# Patient Record
Sex: Female | Born: 1965 | Race: White | Hispanic: No | Marital: Married | Smoking: Never smoker
Health system: Southern US, Community
[De-identification: ages and names within clinical notes are randomized; demographics above are authoritative.]

## PROBLEM LIST (undated history)

## (undated) DIAGNOSIS — M1990 Unspecified osteoarthritis, unspecified site: Secondary | ICD-10-CM

## (undated) DIAGNOSIS — K219 Gastro-esophageal reflux disease without esophagitis: Secondary | ICD-10-CM

## (undated) HISTORY — DX: Gastro-esophageal reflux disease without esophagitis: K21.9

## (undated) HISTORY — PX: CHOLECYSTECTOMY: SHX55

## (undated) HISTORY — PX: SPINAL FUSION: SHX223

## (undated) HISTORY — PX: WISDOM TOOTH EXTRACTION: SHX21

## (undated) HISTORY — DX: Unspecified osteoarthritis, unspecified site: M19.90

---

## 2008-08-11 ENCOUNTER — Ambulatory Visit: Payer: Self-pay | Admitting: Cardiology

## 2011-02-14 ENCOUNTER — Other Ambulatory Visit: Payer: Self-pay | Admitting: Family Medicine

## 2011-02-14 DIAGNOSIS — Z1231 Encounter for screening mammogram for malignant neoplasm of breast: Secondary | ICD-10-CM

## 2011-02-28 ENCOUNTER — Ambulatory Visit
Admission: RE | Admit: 2011-02-28 | Discharge: 2011-02-28 | Disposition: A | Discharge status: Home / Self Care | Payer: BC Managed Care – PPO | Source: Ambulatory Visit | Attending: Family Medicine | Admitting: Family Medicine

## 2011-02-28 DIAGNOSIS — Z1231 Encounter for screening mammogram for malignant neoplasm of breast: Secondary | ICD-10-CM

## 2012-02-19 ENCOUNTER — Other Ambulatory Visit: Payer: Self-pay | Admitting: Family Medicine

## 2012-02-19 DIAGNOSIS — Z1231 Encounter for screening mammogram for malignant neoplasm of breast: Secondary | ICD-10-CM

## 2012-04-05 ENCOUNTER — Ambulatory Visit
Admission: RE | Admit: 2012-04-05 | Discharge: 2012-04-05 | Disposition: A | Discharge status: Home / Self Care | Payer: BC Managed Care – PPO | Source: Ambulatory Visit | Attending: Family Medicine | Admitting: Family Medicine

## 2012-04-05 DIAGNOSIS — Z1231 Encounter for screening mammogram for malignant neoplasm of breast: Secondary | ICD-10-CM

## 2013-03-21 ENCOUNTER — Other Ambulatory Visit: Payer: Self-pay

## 2013-03-21 DIAGNOSIS — Z1231 Encounter for screening mammogram for malignant neoplasm of breast: Secondary | ICD-10-CM

## 2013-05-06 ENCOUNTER — Ambulatory Visit
Admission: RE | Admit: 2013-05-06 | Discharge: 2013-05-06 | Disposition: A | Discharge status: Home / Self Care | Payer: BC Managed Care – PPO | Source: Ambulatory Visit

## 2013-05-06 DIAGNOSIS — Z1231 Encounter for screening mammogram for malignant neoplasm of breast: Secondary | ICD-10-CM

## 2014-05-29 ENCOUNTER — Other Ambulatory Visit: Payer: Self-pay

## 2014-05-29 DIAGNOSIS — Z1231 Encounter for screening mammogram for malignant neoplasm of breast: Secondary | ICD-10-CM

## 2014-06-13 ENCOUNTER — Encounter (INDEPENDENT_AMBULATORY_CARE_PROVIDER_SITE_OTHER): Payer: Self-pay

## 2014-06-13 ENCOUNTER — Ambulatory Visit
Admission: RE | Admit: 2014-06-13 | Discharge: 2014-06-13 | Disposition: A | Discharge status: Home / Self Care | Payer: BLUE CROSS/BLUE SHIELD | Source: Ambulatory Visit

## 2014-06-13 DIAGNOSIS — Z1231 Encounter for screening mammogram for malignant neoplasm of breast: Secondary | ICD-10-CM

## 2015-05-10 ENCOUNTER — Other Ambulatory Visit: Payer: Self-pay

## 2015-05-10 DIAGNOSIS — Z1231 Encounter for screening mammogram for malignant neoplasm of breast: Secondary | ICD-10-CM

## 2015-06-15 ENCOUNTER — Ambulatory Visit
Admission: RE | Admit: 2015-06-15 | Discharge: 2015-06-15 | Disposition: A | Discharge status: Home / Self Care | Payer: BLUE CROSS/BLUE SHIELD | Source: Ambulatory Visit

## 2015-06-15 DIAGNOSIS — Z1231 Encounter for screening mammogram for malignant neoplasm of breast: Secondary | ICD-10-CM

## 2016-02-28 DIAGNOSIS — Z6824 Body mass index (BMI) 24.0-24.9, adult: Secondary | ICD-10-CM | POA: Diagnosis not present

## 2016-02-28 DIAGNOSIS — S43432A Superior glenoid labrum lesion of left shoulder, initial encounter: Secondary | ICD-10-CM | POA: Diagnosis not present

## 2016-02-28 DIAGNOSIS — S46012A Strain of muscle(s) and tendon(s) of the rotator cuff of left shoulder, initial encounter: Secondary | ICD-10-CM | POA: Diagnosis not present

## 2016-03-05 DIAGNOSIS — Z23 Encounter for immunization: Secondary | ICD-10-CM | POA: Diagnosis not present

## 2016-05-09 ENCOUNTER — Other Ambulatory Visit: Payer: Self-pay | Admitting: Family Medicine

## 2016-05-09 DIAGNOSIS — Z1231 Encounter for screening mammogram for malignant neoplasm of breast: Secondary | ICD-10-CM

## 2016-05-15 DIAGNOSIS — J0101 Acute recurrent maxillary sinusitis: Secondary | ICD-10-CM | POA: Diagnosis not present

## 2016-06-05 DIAGNOSIS — M542 Cervicalgia: Secondary | ICD-10-CM | POA: Diagnosis not present

## 2016-06-05 DIAGNOSIS — M62830 Muscle spasm of back: Secondary | ICD-10-CM | POA: Diagnosis not present

## 2016-06-16 ENCOUNTER — Ambulatory Visit
Admission: RE | Admit: 2016-06-16 | Discharge: 2016-06-16 | Disposition: A | Discharge status: Home / Self Care | Payer: BLUE CROSS/BLUE SHIELD | Source: Ambulatory Visit | Attending: Family Medicine | Admitting: Family Medicine

## 2016-06-16 DIAGNOSIS — Z1231 Encounter for screening mammogram for malignant neoplasm of breast: Secondary | ICD-10-CM

## 2016-08-04 DIAGNOSIS — M25512 Pain in left shoulder: Secondary | ICD-10-CM | POA: Diagnosis not present

## 2016-08-04 DIAGNOSIS — M25552 Pain in left hip: Secondary | ICD-10-CM | POA: Diagnosis not present

## 2016-08-04 DIAGNOSIS — M25511 Pain in right shoulder: Secondary | ICD-10-CM | POA: Diagnosis not present

## 2016-08-04 DIAGNOSIS — M25551 Pain in right hip: Secondary | ICD-10-CM | POA: Diagnosis not present

## 2017-02-12 DIAGNOSIS — Z01419 Encounter for gynecological examination (general) (routine) without abnormal findings: Secondary | ICD-10-CM | POA: Diagnosis not present

## 2017-02-12 DIAGNOSIS — Z6823 Body mass index (BMI) 23.0-23.9, adult: Secondary | ICD-10-CM | POA: Diagnosis not present

## 2017-03-23 DIAGNOSIS — R87612 Low grade squamous intraepithelial lesion on cytologic smear of cervix (LGSIL): Secondary | ICD-10-CM | POA: Diagnosis not present

## 2017-03-23 DIAGNOSIS — N898 Other specified noninflammatory disorders of vagina: Secondary | ICD-10-CM | POA: Diagnosis not present

## 2017-05-27 ENCOUNTER — Other Ambulatory Visit: Payer: Self-pay | Admitting: Family Medicine

## 2017-05-27 DIAGNOSIS — Z1231 Encounter for screening mammogram for malignant neoplasm of breast: Secondary | ICD-10-CM

## 2017-06-29 ENCOUNTER — Ambulatory Visit
Admission: RE | Admit: 2017-06-29 | Discharge: 2017-06-29 | Disposition: A | Discharge status: Home / Self Care | Payer: BLUE CROSS/BLUE SHIELD | Source: Ambulatory Visit | Attending: Family Medicine | Admitting: Family Medicine

## 2017-06-29 DIAGNOSIS — Z1231 Encounter for screening mammogram for malignant neoplasm of breast: Secondary | ICD-10-CM | POA: Diagnosis not present

## 2017-07-14 DIAGNOSIS — J0100 Acute maxillary sinusitis, unspecified: Secondary | ICD-10-CM | POA: Diagnosis not present

## 2017-07-14 DIAGNOSIS — Z6823 Body mass index (BMI) 23.0-23.9, adult: Secondary | ICD-10-CM | POA: Diagnosis not present

## 2017-09-08 DIAGNOSIS — B977 Papillomavirus as the cause of diseases classified elsewhere: Secondary | ICD-10-CM | POA: Diagnosis not present

## 2017-09-08 DIAGNOSIS — R87612 Low grade squamous intraepithelial lesion on cytologic smear of cervix (LGSIL): Secondary | ICD-10-CM | POA: Diagnosis not present

## 2017-09-08 DIAGNOSIS — Z6823 Body mass index (BMI) 23.0-23.9, adult: Secondary | ICD-10-CM | POA: Diagnosis not present

## 2017-09-17 DIAGNOSIS — M542 Cervicalgia: Secondary | ICD-10-CM | POA: Diagnosis not present

## 2017-10-18 DIAGNOSIS — M545 Low back pain: Secondary | ICD-10-CM | POA: Diagnosis not present

## 2017-10-18 DIAGNOSIS — M5136 Other intervertebral disc degeneration, lumbar region: Secondary | ICD-10-CM | POA: Diagnosis not present

## 2017-10-20 DIAGNOSIS — Z6824 Body mass index (BMI) 24.0-24.9, adult: Secondary | ICD-10-CM | POA: Diagnosis not present

## 2017-10-20 DIAGNOSIS — M545 Low back pain: Secondary | ICD-10-CM | POA: Diagnosis not present

## 2018-04-29 DIAGNOSIS — Z6823 Body mass index (BMI) 23.0-23.9, adult: Secondary | ICD-10-CM | POA: Diagnosis not present

## 2018-04-29 DIAGNOSIS — Z01419 Encounter for gynecological examination (general) (routine) without abnormal findings: Secondary | ICD-10-CM | POA: Diagnosis not present

## 2018-06-04 ENCOUNTER — Other Ambulatory Visit: Payer: Self-pay | Admitting: Family Medicine

## 2018-06-04 DIAGNOSIS — Z1231 Encounter for screening mammogram for malignant neoplasm of breast: Secondary | ICD-10-CM

## 2018-07-05 ENCOUNTER — Ambulatory Visit
Admission: RE | Admit: 2018-07-05 | Discharge: 2018-07-05 | Disposition: A | Discharge status: Home / Self Care | Payer: BLUE CROSS/BLUE SHIELD | Source: Ambulatory Visit | Attending: Family Medicine | Admitting: Family Medicine

## 2018-07-05 DIAGNOSIS — Z1231 Encounter for screening mammogram for malignant neoplasm of breast: Secondary | ICD-10-CM

## 2019-07-17 DIAGNOSIS — R319 Hematuria, unspecified: Secondary | ICD-10-CM | POA: Diagnosis not present

## 2019-07-17 DIAGNOSIS — N23 Unspecified renal colic: Secondary | ICD-10-CM | POA: Diagnosis not present

## 2019-07-18 DIAGNOSIS — N23 Unspecified renal colic: Secondary | ICD-10-CM | POA: Diagnosis not present

## 2019-07-18 DIAGNOSIS — R1032 Left lower quadrant pain: Secondary | ICD-10-CM | POA: Diagnosis not present

## 2019-07-18 DIAGNOSIS — R3129 Other microscopic hematuria: Secondary | ICD-10-CM | POA: Diagnosis not present

## 2019-07-18 DIAGNOSIS — M25552 Pain in left hip: Secondary | ICD-10-CM | POA: Diagnosis not present

## 2019-07-18 DIAGNOSIS — R109 Unspecified abdominal pain: Secondary | ICD-10-CM | POA: Diagnosis not present

## 2019-07-28 DIAGNOSIS — Z01419 Encounter for gynecological examination (general) (routine) without abnormal findings: Secondary | ICD-10-CM | POA: Diagnosis not present

## 2019-07-28 DIAGNOSIS — Z6825 Body mass index (BMI) 25.0-25.9, adult: Secondary | ICD-10-CM | POA: Diagnosis not present

## 2019-09-02 DIAGNOSIS — Z1231 Encounter for screening mammogram for malignant neoplasm of breast: Secondary | ICD-10-CM | POA: Diagnosis not present

## 2020-09-06 IMAGING — MG DIGITAL SCREENING BILATERAL MAMMOGRAM WITH TOMO AND CAD
8 series · 8 of 24 positions shown · non-contrast
Comparison: Previous exam(s).

CLINICAL DATA: Screening.

EXAM:
DIGITAL SCREENING BILATERAL MAMMOGRAM WITH TOMO AND CAD

[L MLO synth-2D]
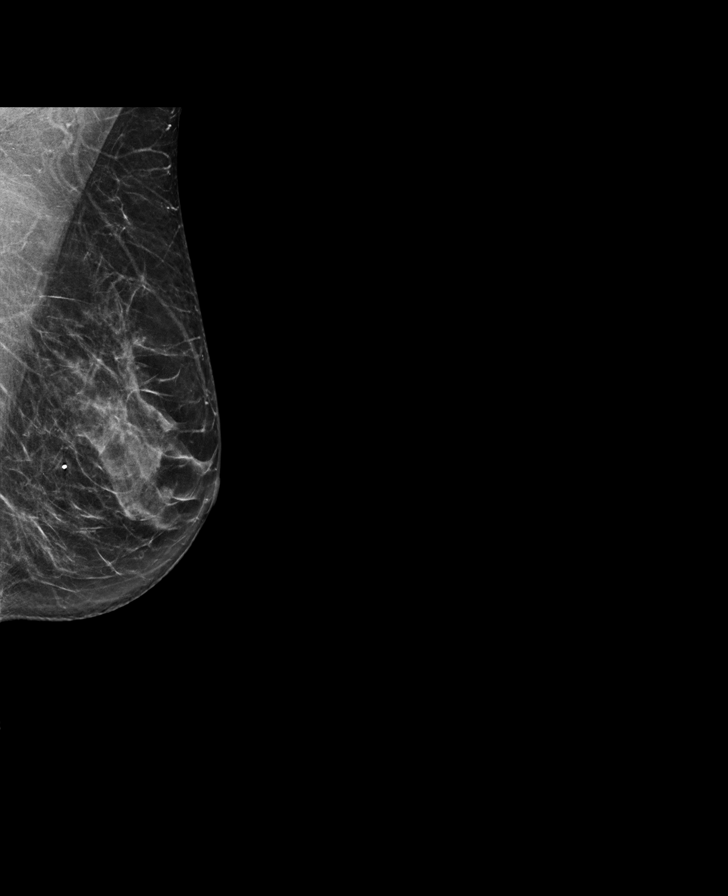

[R CC synth-2D]
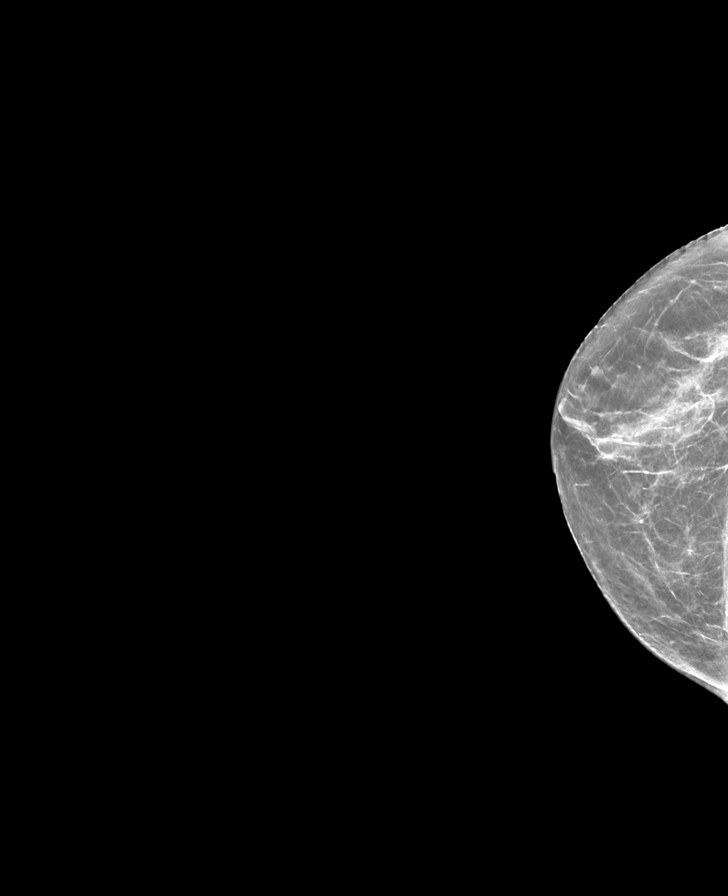

[L CC synth-2D]
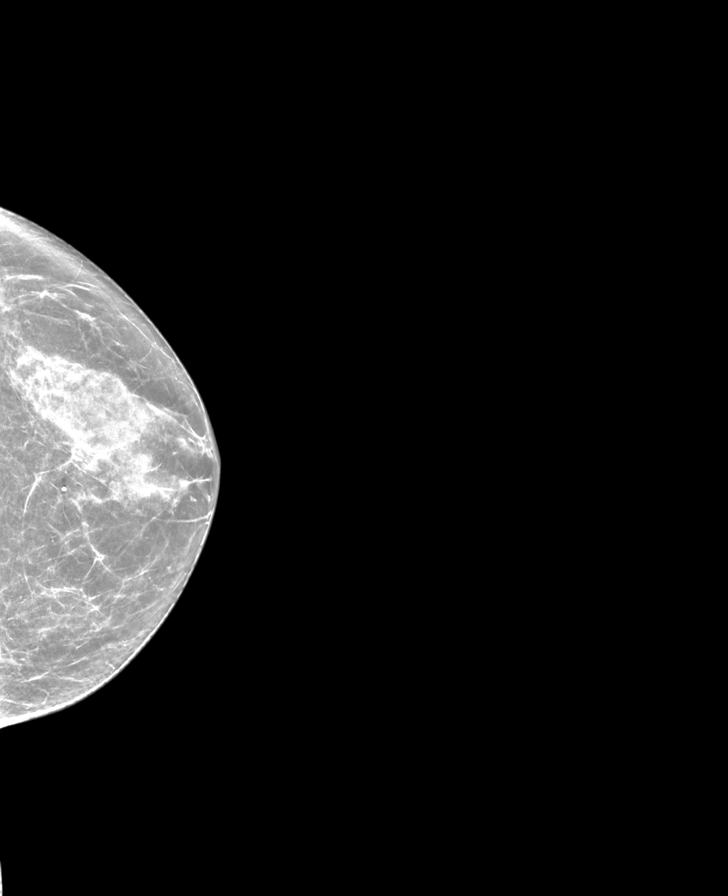

[R MLO synth-2D]
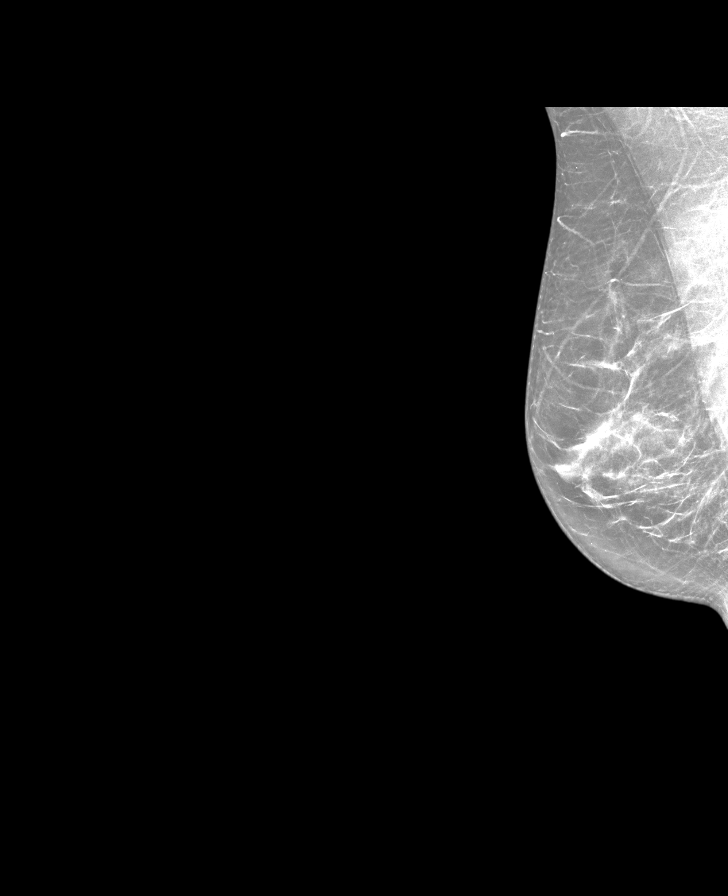

[R MLO tomo · tomo slice 32/63.0]
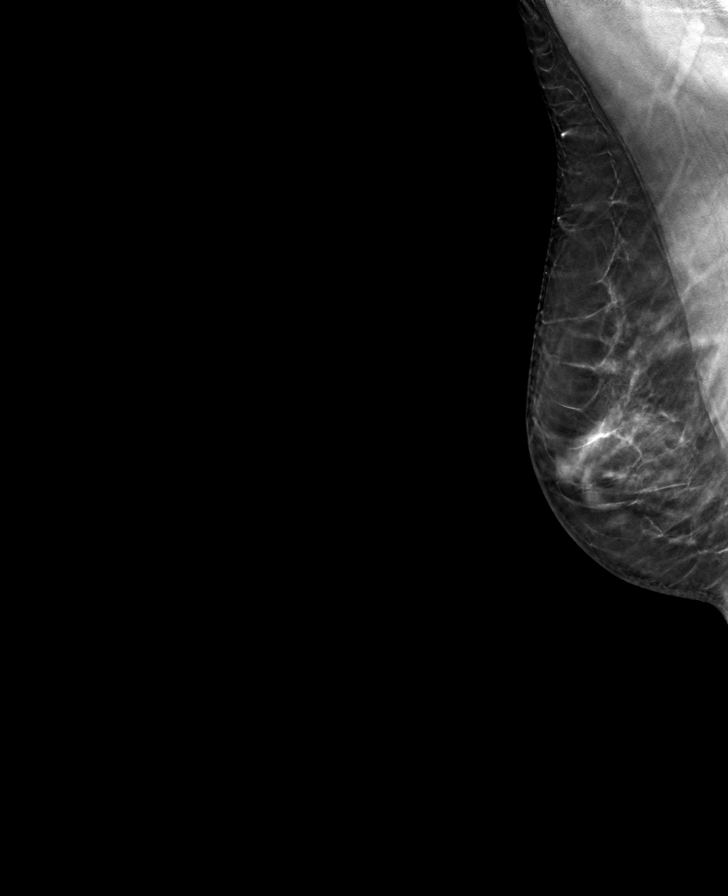

[R CC tomo · tomo slice 29/56.0]
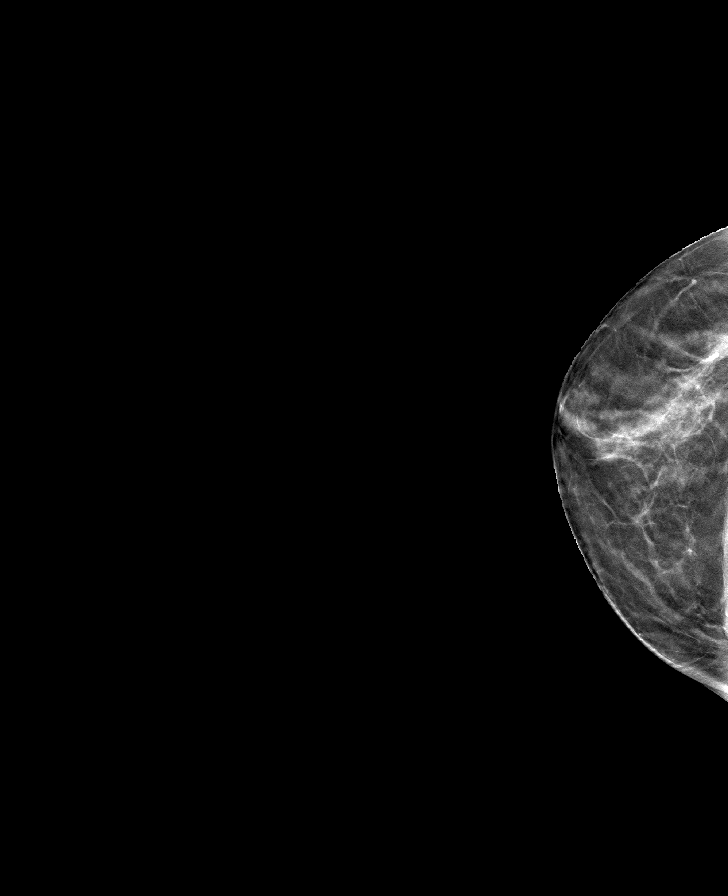

[L CC tomo · tomo slice 29/57.0]
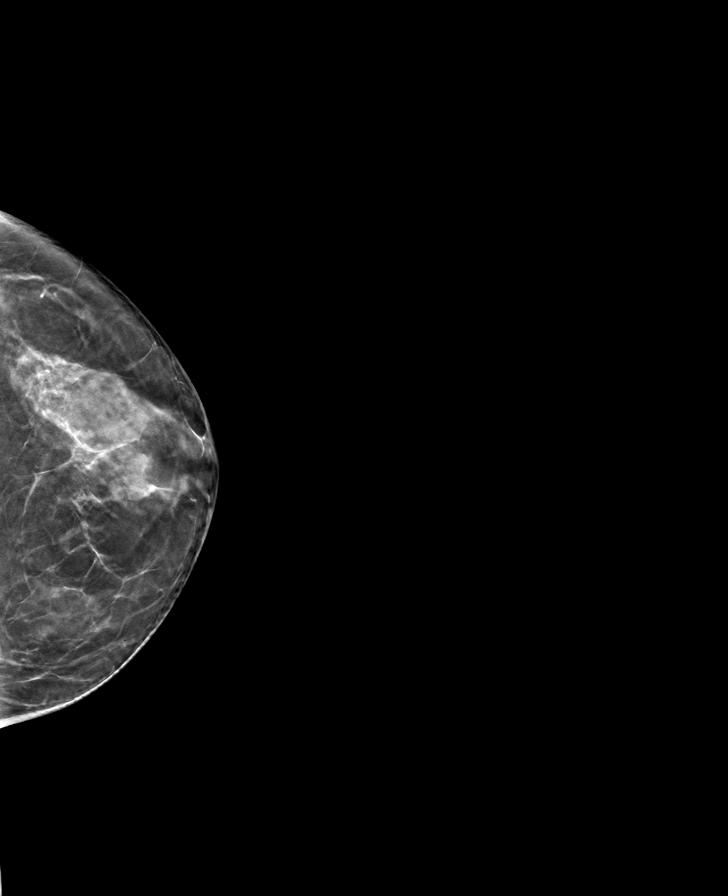

[L MLO tomo · tomo slice 31/62.0]
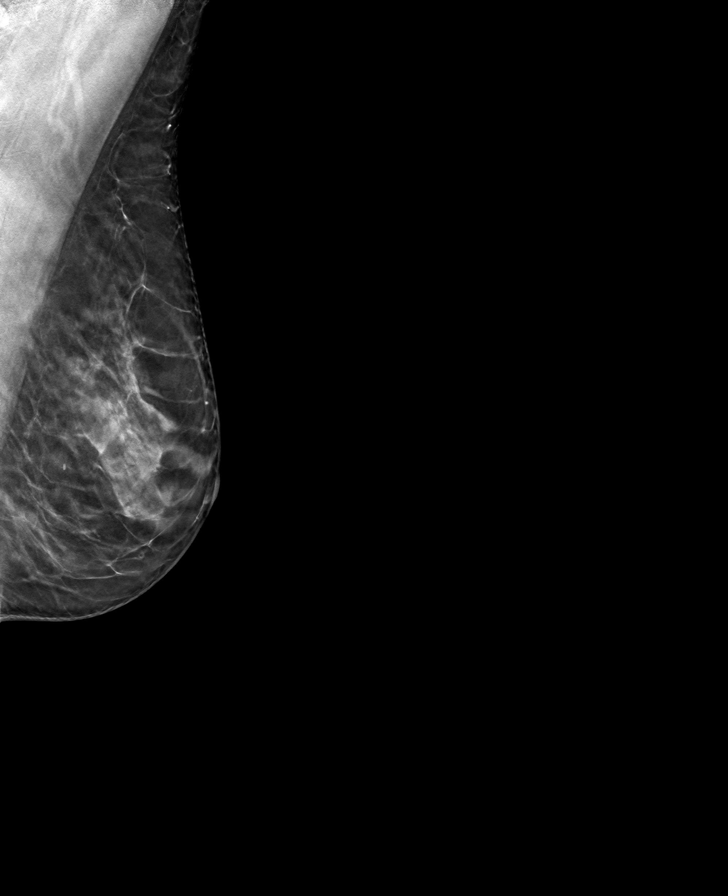

[8 of 24 positions shown; findings below may reference images not displayed]

ACR Breast Density Category c: The breast tissue is heterogeneously
dense, which may obscure small masses.
FINDINGS: There are no findings suspicious for malignancy. Images were
processed with CAD.
IMPRESSION: No mammographic evidence of malignancy. A result letter of this
screening mammogram will be mailed directly to the patient.

RECOMMENDATION:
Screening mammogram in one year. (Code:[id number])

BI-RADS CATEGORY  1: Negative.

## 2021-10-22 NOTE — Progress Notes (Signed)
[facility] Note  10/25/2021     CHIEF COMPLAINT Patient presents for Retina Evaluation   HISTORY OF PRESENT ILLNESS: Sonya Anderson is a 56 y.o. female who presents to the clinic today for:   HPI     Retina Evaluation   In left eye.  This started weeks ago.  Associated Symptoms Distortion.  Negative for Flashes, Floaters, Blind Spot and Pain.  Context:  near vision, reading and computer work.  I, the attending physician,  performed the HPI with the patient and updated documentation appropriately.        Comments   Patient is here based on a referral from Dr. Hassell Done for CME and BRO OS. Patient states that her near vision is blurry.       Last edited by [name], MD on 10/28/2021  8:38 AM.    Pt is here on the referral of Dr. Hassell Done for concern of BRVO OS, pt states she just went to see him for her regular exam to get CL, pt states vision may be a little blurry, she works on a computer all day, pt states she does not have any medical problems, she does not take medication for BP, she states she works out 5 times a week  Referring physician: [name], [place], [place] [address]  HISTORICAL INFORMATION:   Selected notes from the [place] Referred by Dr. Hassell Done for BRVO w/ CME OS LEE:  Ocular Hx- CME and BRVO OS PMH-    CURRENT MEDICATIONS: No current outpatient medications on file. (Ophthalmic Drugs)   No current facility-administered medications for this visit. (Ophthalmic Drugs)   No current outpatient medications on file. (Other)   No current facility-administered medications for this visit. (Other)   REVIEW OF SYSTEMS: ROS   Positive for: Eyes Last edited by Annie Paras, COT on 10/25/2021  9:24 AM.     ALLERGIES No Known Allergies  PAST MEDICAL HISTORY History reviewed. No pertinent past medical history. History reviewed. No pertinent surgical history.  FAMILY HISTORY History reviewed.  No pertinent family history.  SOCIAL HISTORY Social History   Tobacco Use   Smoking status: Never   Smokeless tobacco: Never       OPHTHALMIC EXAM:  Base Eye Exam     Visual Acuity (Snellen - Linear)       Right Left   Dist cc 20/20 20/25    Correction: Glasses         Tonometry (Tonopen, 9:33 AM)       Right Left   Pressure 18 18         Pupils       Pupils Dark Light Shape React APD   Right PERRL 4 3 Round Brisk None   Left PERRL 4 3 Round Brisk None         Visual Fields       Left Right    Full Full         Extraocular Movement       Right Left    Full, Ortho Full, Ortho         Neuro/Psych     Oriented x3: Yes         Dilation     Both eyes: 1.0% Mydriacyl, 2.5% Phenylephrine @ 9:26 AM           Slit Lamp and Fundus Exam     Slit Lamp Exam  Right Left   Lids/Lashes Dermatochalasis - upper lid Dermatochalasis - upper lid   Conjunctiva/Sclera White and quiet White and quiet   Cornea Trace tear film debris Trace tear film debris   Anterior Chamber deep, clear, narrow temporal angle deep, clear, narrow temporal angle   Iris Round and dilated Round and dilated   Lens Clear Clear   Anterior Vitreous mild syneresis mild syneresis         Fundus Exam       Right Left   Disc Pink and Sharp, mild PPP Pink and Sharp, mild PPP   C/D Ratio 0.2 0.2   Macula Flat, Good foveal reflex, RPE mottling, No heme or edema Blunted foveal reflex, flame hemes and DBH inferior macula extending to IT arcades, +edema centrally   Vessels mild attenuation, mild tortuosity, mild copper wiring attenuated, Tortuous -- IT BRVO   Periphery Attached Attached           Refraction     Wearing Rx       Sphere Cylinder Axis   Right +2.75 +0.25 117   Left +3.25 +0.25 060         Manifest Refraction       Sphere Cylinder Axis Dist VA   Right +2.75 +0.25 117 20/20   Left +3.25 +0.25 060 20/25           IMAGING AND PROCEDURES   Imaging and Procedures for 10/25/2021  OCT, Retina - OU - Both Eyes       Right Eye Quality was good. Central Foveal Thickness: 264. Progression has no prior data. Findings include normal foveal contour, no IRF, no SRF, vitreomacular adhesion .   Left Eye Quality was good. Central Foveal Thickness: 311. Progression has no prior data. Findings include abnormal foveal contour, intraretinal fluid, no SRF, vitreomacular adhesion (Inferior BRVO with central IRF).   Notes *Images captured and stored on drive  Diagnosis / Impression:  OD: NFP, no IRF/SRF OS: inferior BRVO with central IRF   Clinical management:  See below  Abbreviations: NFP - Normal foveal profile. CME - cystoid macular edema. PED - pigment epithelial detachment. IRF - intraretinal fluid. SRF - subretinal fluid. EZ - ellipsoid zone. ERM - epiretinal membrane. ORA - outer retinal atrophy. ORT - outer retinal tubulation. SRHM - subretinal hyper-reflective material. IRHM - intraretinal hyper-reflective material      Fluorescein Angiography Optos (Transit OS)       Right Eye Progression has no prior data. Early phase findings include normal observations. Mid/Late phase findings include normal observations.   Left Eye Progression has no prior data. Early phase findings include staining. Mid/Late phase findings include staining, leakage.   Notes **Images stored on drive**  Impression: OD: normal study OS: focal BRVO inferior macula            ASSESSMENT/PLAN:    ICD-10-CM   1. Branch retinal vein occlusion of left eye with macular edema  H34.8320 OCT, Retina - OU - Both Eyes    Fluorescein Angiography Optos (Transit OS)    2. Essential hypertension  I10     3. Hypertensive retinopathy of both eyes  H35.033 Fluorescein Angiography Optos (Transit OS)     BRVO w/ CME OS - The natural history of retinal vein occlusion and macular edema and treatment options including observation, laser photocoagulation,  and intravitreal antiVEGF injection with Avastin and Lucentis and Eylea and intravitreal injection of steroids with triamcinolone and Ozurdex and the complications of these procedures including loss  of vision, infection, cataract, glaucoma, and retinal detachment were discussed with patient. - Specifically discussed findings from [place] / [place] study regarding patient stabilization with anti-VEGF agents and increased potential for visual improvements.  Also discussed need for frequent follow up and potentially multiple injections given the chronic nature of the disease process - BCVA 20/25 -- pt asymptomatic and didn't realize she had an issue until her routine eye exam - OCT shows inferior BRVO with central IRF OS - FA (06.02.23) shows focal BRVO inferior macula - recommend IVA OS #1 today, 06.02.23 - RBA of procedure discussed, questions answered - will bring pt back on Wednesday, June 7 for injection - waiting on insurance auth - F/U June 7 -- DFE/OCT/possible injection  2,3. Hypertensive retinopathy OU - discussed importance of tight BP control - monitor  Ophthalmic Meds Ordered this visit:  No orders of the defined types were placed in this encounter.    Return for f/u June 7 for BRVO OS, DFE, OCT.  There are no Patient Instructions on file for this visit.   Explained the diagnoses, plan, and follow up with the patient and they expressed understanding.  Patient expressed understanding of the importance of proper follow up care.   This document serves as a record of services personally performed by Gardiner Sleeper, MD, PhD. It was created on their behalf by Leonie Douglas, an ophthalmic technician. The creation of this record is the provider's dictation and/or activities during the visit.    Electronically signed by: Leonie Douglas COA, 10/28/21  8:41 AM  This document serves as a record of services personally performed by Gardiner Sleeper, MD, PhD. It was created on their behalf by  San Jetty. Owens Shark, OA an ophthalmic technician. The creation of this record is the provider's dictation and/or activities during the visit.    Electronically signed by: San Jetty. Owens Shark, [place] 06.02.2023 8:41 AM  Gardiner Sleeper, M.D., Ph.D. Diseases & Surgery of the Retina and Vitreous [facility]  I have reviewed the above documentation for accuracy and completeness, and I agree with the above. Gardiner Sleeper, M.D., Ph.D. 10/28/21 8:41 AM  Abbreviations: M myopia (nearsighted); A astigmatism; H hyperopia (farsighted); P presbyopia; Mrx spectacle prescription;  CTL contact lenses; OD right eye; OS left eye; OU both eyes  XT exotropia; ET esotropia; PEK punctate epithelial keratitis; PEE punctate epithelial erosions; DES dry eye syndrome; MGD meibomian gland dysfunction; ATs artificial tears; PFAT's preservative free artificial tears; [place] nuclear sclerotic cataract; PSC posterior subcapsular cataract; ERM epi-retinal membrane; PVD posterior vitreous detachment; RD retinal detachment; DM diabetes mellitus; DR diabetic retinopathy; NPDR non-proliferative diabetic retinopathy; PDR proliferative diabetic retinopathy; CSME clinically significant macular edema; DME diabetic macular edema; dbh dot blot hemorrhages; CWS cotton wool spot; POAG primary open angle glaucoma; C/D cup-to-disc ratio; HVF humphrey visual field; GVF goldmann visual field; OCT optical coherence tomography; IOP intraocular pressure; BRVO Branch retinal vein occlusion; CRVO central retinal vein occlusion; CRAO central retinal artery occlusion; BRAO branch retinal artery occlusion; RT retinal tear; SB scleral buckle; PPV pars plana vitrectomy; VH Vitreous hemorrhage; PRP panretinal laser photocoagulation; IVK intravitreal kenalog; VMT vitreomacular traction; MH Macular hole;  NVD neovascularization of the disc; NVE neovascularization elsewhere; AREDS age related eye disease study; ARMD age related macular degeneration; POAG  primary open angle glaucoma; EBMD epithelial/anterior basement membrane dystrophy; ACIOL anterior chamber intraocular lens; IOL intraocular lens; PCIOL posterior chamber intraocular lens; Phaco/IOL phacoemulsification with intraocular lens placement; PRK photorefractive keratectomy; LASIK laser  assisted in situ keratomileusis; HTN hypertension; DM diabetes mellitus; COPD chronic obstructive pulmonary disease

## 2021-10-25 ENCOUNTER — Encounter (INDEPENDENT_AMBULATORY_CARE_PROVIDER_SITE_OTHER): Payer: Self-pay | Admitting: Ophthalmology

## 2021-10-25 ENCOUNTER — Ambulatory Visit (INDEPENDENT_AMBULATORY_CARE_PROVIDER_SITE_OTHER): Payer: BC Managed Care – PPO | Admitting: Ophthalmology

## 2021-10-25 DIAGNOSIS — H34832 Tributary (branch) retinal vein occlusion, left eye, with macular edema: Secondary | ICD-10-CM | POA: Diagnosis not present

## 2021-10-25 DIAGNOSIS — H35033 Hypertensive retinopathy, bilateral: Secondary | ICD-10-CM | POA: Diagnosis not present

## 2021-10-25 DIAGNOSIS — H3581 Retinal edema: Secondary | ICD-10-CM

## 2021-10-25 DIAGNOSIS — I10 Essential (primary) hypertension: Secondary | ICD-10-CM

## 2021-10-28 ENCOUNTER — Encounter (INDEPENDENT_AMBULATORY_CARE_PROVIDER_SITE_OTHER): Payer: Self-pay | Admitting: Ophthalmology

## 2021-10-28 NOTE — Progress Notes (Signed)
[facility] - Clinic Note  10/30/2021     CHIEF COMPLAINT Patient presents for Retina Follow Up   HISTORY OF PRESENT ILLNESS: Sonya Anderson is a 56 y.o. female who presents to the clinic today for:   HPI     Retina Follow Up   Patient presents with  CRVO/BRVO.  In left eye.  This started weeks ago.  Duration of 5 days.  Since onset it is stable.  I, the attending physician,  performed the HPI with the patient and updated documentation appropriately.        Comments   Patient states that the vision is the same. She is here today for an injection of the left eye.       Last edited by Rennis ChrisZamora, Kahla Risdon, MD on 10/30/2021 12:14 PM.      Referring physician: Smitty Cordsustin Martin, OD [address]  [place], [place] [id number]  HISTORICAL INFORMATION:   Selected notes from the MEDICAL RECORD NUMBER Referred by Dr. Daphine DeutscherMartin for BRVO w/ CME OS LEE:  Ocular Hx- CME and BRVO OS PMH-    CURRENT MEDICATIONS: No current outpatient medications on file. (Ophthalmic Drugs)   No current facility-administered medications for this visit. (Ophthalmic Drugs)   No current outpatient medications on file. (Other)   No current facility-administered medications for this visit. (Other)   REVIEW OF SYSTEMS: ROS   Positive for: Eyes Last edited by Julieanne CottonShamlian, Christine N, COT on 10/30/2021  7:47 AM.     ALLERGIES No Known Allergies  PAST MEDICAL HISTORY History reviewed. No pertinent past medical history. History reviewed. No pertinent surgical history.  FAMILY HISTORY History reviewed. No pertinent family history.  SOCIAL HISTORY Social History   Tobacco Use   Smoking status: Never   Smokeless tobacco: Never       OPHTHALMIC EXAM:  Base Eye Exam     Visual Acuity (Snellen - Linear)       Right Left   Dist cc 20/20 20/25    Correction: Glasses         Tonometry (Tonopen, 7:50 AM)       Right Left   Pressure 18 16         Pupils       Pupils Dark Light Shape  React APD   Right PERRL 4 3 Round Brisk None   Left PERRL 4 3 Round Brisk None         Visual Fields       Left Right    Full Full         Extraocular Movement       Right Left    Full, Ortho Full, Ortho         Neuro/Psych     Oriented x3: Yes         Dilation     Both eyes: 1.0% Mydriacyl, 2.5% Phenylephrine @ 7:48 AM           Slit Lamp and Fundus Exam     Slit Lamp Exam       Right Left   Lids/Lashes Dermatochalasis - upper lid Dermatochalasis - upper lid   Conjunctiva/Sclera White and quiet White and quiet   Cornea Trace tear film debris Trace tear film debris   Anterior Chamber deep, clear, narrow temporal angle deep, clear, narrow temporal angle   Iris Round and dilated Round and dilated   Lens Clear Clear   Anterior Vitreous mild syneresis mild syneresis  Fundus Exam       Right Left   Disc Pink and Sharp, mild PPP Pink and Sharp, mild PPP   C/D Ratio 0.2 0.2   Macula Flat, Good foveal reflex, RPE mottling, No heme or edema Blunted foveal reflex, flame hemes and DBH inferior macula extending to IT arcades, +edema centrally   Vessels mild attenuation, mild tortuosity, mild copper wiring attenuated, Tortuous -- IT BRVO   Periphery Attached Attached           Refraction     Wearing Rx       Sphere Cylinder Axis   Right +2.75 +0.25 117   Left +3.25 +0.25 060           IMAGING AND PROCEDURES  Imaging and Procedures for 10/30/2021  OCT, Retina - OU - Both Eyes       Right Eye Quality was good. Central Foveal Thickness: 264. Progression has been stable. Findings include normal foveal contour, no IRF, no SRF, vitreomacular adhesion .   Left Eye Quality was good. Central Foveal Thickness: 315. Progression has worsened. Findings include abnormal foveal contour, intraretinal fluid, no SRF, vitreomacular adhesion (Mild interval increase in IRF).   Notes *Images captured and stored on drive  Diagnosis / Impression:  OD:  NFP, no IRF/SRF OS: BRVO w/ CME -- Mild interval increase in IRF  Clinical management:  See below  Abbreviations: NFP - Normal foveal profile. CME - cystoid macular edema. PED - pigment epithelial detachment. IRF - intraretinal fluid. SRF - subretinal fluid. EZ - ellipsoid zone. ERM - epiretinal membrane. ORA - outer retinal atrophy. ORT - outer retinal tubulation. SRHM - subretinal hyper-reflective material. IRHM - intraretinal hyper-reflective material      Intravitreal Injection, Pharmacologic Agent - OS - Left Eye       Time Out 10/30/2021. 8:25 AM. Confirmed correct patient, procedure, site, and patient consented.   Anesthesia Topical anesthesia was used. Anesthetic medications included Lidocaine 2%, Proparacaine 0.5%.   Procedure Preparation included 5% betadine to ocular surface, eyelid speculum. A (32g) needle was used.   Injection: 1.25 mg Bevacizumab 1.25mg /0.45ml   Route: Intravitreal, Site: Left Eye   NDC: [id number], Lot: [id number], Expiration date: 12/08/2021   Post-op Post injection exam found visual acuity of at least counting fingers. The patient tolerated the procedure well. There were no complications. The patient received written and verbal post procedure care education.            ASSESSMENT/PLAN:    ICD-10-CM   1. Branch retinal vein occlusion of left eye with macular edema  H34.8320 OCT, Retina - OU - Both Eyes    Intravitreal Injection, Pharmacologic Agent - OS - Left Eye    Bevacizumab (AVASTIN) SOLN 1.25 mg     BRVO w/ CME OS - FA (06.02.23) shows focal BRVO inferior macula - BCVA 20/25 -- pt asymptomatic and didn't realize she had an issue until her routine eye exam - OCT shows mild interval increase in IRF - recommend IVA OS #1 today, 06.07.23 - pt wishes to proceed with injection - RBA of procedure discussed, questions answered - IVA informed consent obtained and signed, OS (06.07.23) - see procedure note - f/u 4 weeks, DFE,  OCT  Ophthalmic Meds Ordered this visit:  Meds ordered this encounter  Medications   Bevacizumab (AVASTIN) SOLN 1.25 mg     Return in about 4 weeks (around 11/27/2021) for f/u BRVO OS, DFE, OCT.  There are no Patient Instructions on file for this  visit.  Explained the diagnoses, plan, and follow up with the patient and they expressed understanding.  Patient expressed understanding of the importance of proper follow up care.   This document serves as a record of services personally performed by Karie Chimera, MD, PhD. It was created on their behalf by De Blanch, an ophthalmic technician. The creation of this record is the provider's dictation and/or activities during the visit.    Electronically signed by: De Blanch, OA, 10/30/21  12:16 PM  This document serves as a record of services personally performed by Karie Chimera, MD, PhD. It was created on their behalf by Glee Arvin. Manson Passey, OA an ophthalmic technician. The creation of this record is the provider's dictation and/or activities during the visit.    Electronically signed by: Glee Arvin. Manson Passey, [place] 06.07.2023 12:16 PM  Karie Chimera, M.D., Ph.D. Diseases & Surgery of the Retina and Vitreous [facility]  I have reviewed the above documentation for accuracy and completeness, and I agree with the above. Karie Chimera, M.D., Ph.D. 10/30/21 12:17 PM  Abbreviations: M myopia (nearsighted); A astigmatism; H hyperopia (farsighted); P presbyopia; Mrx spectacle prescription;  CTL contact lenses; OD right eye; OS left eye; OU both eyes  XT exotropia; ET esotropia; PEK punctate epithelial keratitis; PEE punctate epithelial erosions; DES dry eye syndrome; MGD meibomian gland dysfunction; ATs artificial tears; PFAT's preservative free artificial tears; NSC nuclear sclerotic cataract; PSC posterior subcapsular cataract; ERM epi-retinal membrane; PVD posterior vitreous detachment; RD retinal detachment; DM  diabetes mellitus; DR diabetic retinopathy; NPDR non-proliferative diabetic retinopathy; PDR proliferative diabetic retinopathy; CSME clinically significant macular edema; DME diabetic macular edema; dbh dot blot hemorrhages; CWS cotton wool spot; POAG primary open angle glaucoma; C/D cup-to-disc ratio; HVF humphrey visual field; GVF goldmann visual field; OCT optical coherence tomography; IOP intraocular pressure; BRVO Branch retinal vein occlusion; CRVO central retinal vein occlusion; CRAO central retinal artery occlusion; BRAO branch retinal artery occlusion; RT retinal tear; SB scleral buckle; PPV pars plana vitrectomy; VH Vitreous hemorrhage; PRP panretinal laser photocoagulation; IVK intravitreal kenalog; VMT vitreomacular traction; MH Macular hole;  NVD neovascularization of the disc; NVE neovascularization elsewhere; AREDS age related eye disease study; ARMD age related macular degeneration; POAG primary open angle glaucoma; EBMD epithelial/anterior basement membrane dystrophy; ACIOL anterior chamber intraocular lens; IOL intraocular lens; PCIOL posterior chamber intraocular lens; Phaco/IOL phacoemulsification with intraocular lens placement; PRK photorefractive keratectomy; LASIK laser assisted in situ keratomileusis; HTN hypertension; DM diabetes mellitus; COPD chronic obstructive pulmonary disease

## 2021-10-30 ENCOUNTER — Ambulatory Visit (INDEPENDENT_AMBULATORY_CARE_PROVIDER_SITE_OTHER): Payer: BC Managed Care – PPO | Admitting: Ophthalmology

## 2021-10-30 ENCOUNTER — Encounter (INDEPENDENT_AMBULATORY_CARE_PROVIDER_SITE_OTHER): Payer: Self-pay | Admitting: Ophthalmology

## 2021-10-30 DIAGNOSIS — H34832 Tributary (branch) retinal vein occlusion, left eye, with macular edema: Secondary | ICD-10-CM

## 2021-10-30 DIAGNOSIS — H35033 Hypertensive retinopathy, bilateral: Secondary | ICD-10-CM

## 2021-10-30 DIAGNOSIS — I10 Essential (primary) hypertension: Secondary | ICD-10-CM

## 2021-10-30 MED ORDER — BEVACIZUMAB CHEMO INJECTION 1.25MG/0.05ML SYRINGE FOR KALEIDOSCOPE
1.2500 mg | INTRAVITREAL | Status: AC | PRN
  Administered 2021-10-30: 1.25 mg via INTRAVITREAL

## 2021-11-14 NOTE — Progress Notes (Signed)
[facility] Note  11/28/2021     CHIEF COMPLAINT Patient presents for Retina Follow Up   HISTORY OF PRESENT ILLNESS: Sonya Anderson is a 56 y.o. female who presents to the clinic today for:   HPI     Retina Follow Up   Patient presents with  CRVO/BRVO.  In left eye.  This started 4 weeks ago.  I, the attending physician,  performed the HPI with the patient and updated documentation appropriately.        Comments   Patient here for 4 weeks retina follow up for BRVO OS. Patient states vision doing ok. No eye pain.      Last edited by [name], MD on 11/28/2021 12:03 PM.    Pt states no problems after first injection, she has not noticed any change in vision   Referring physician: [name], [place], [place] [id number]  HISTORICAL INFORMATION:   Selected notes from the MEDICAL RECORD NUMBER Referred by Dr. Hassell Done for BRVO w/ CME OS LEE:  Ocular Hx- CME and BRVO OS PMH-    CURRENT MEDICATIONS: No current outpatient medications on file. (Ophthalmic Drugs)   No current facility-administered medications for this visit. (Ophthalmic Drugs)   No current outpatient medications on file. (Other)   No current facility-administered medications for this visit. (Other)   REVIEW OF SYSTEMS: ROS   Positive for: Eyes Last edited by Theodore Demark, COA on 11/28/2021  8:03 AM.      ALLERGIES No Known Allergies  PAST MEDICAL HISTORY History reviewed. No pertinent past medical history. History reviewed. No pertinent surgical history.  FAMILY HISTORY History reviewed. No pertinent family history.  SOCIAL HISTORY Social History   Tobacco Use   Smoking status: Never   Smokeless tobacco: Never  Vaping Use   Vaping Use: Never used       OPHTHALMIC EXAM:  Base Eye Exam     Visual Acuity (Snellen - Linear)       Right Left   Dist cc 20/20 20/20         Tonometry (Tonopen, 8:00 AM)       Right Left   Pressure 07  10         Pupils       Dark Light Shape React APD   Right 4 3 Round Brisk None   Left 4 3 Round Brisk None         Visual Fields (Counting fingers)       Left Right    Full Full         Extraocular Movement       Right Left    Full, Ortho Full, Ortho         Neuro/Psych     Oriented x3: Yes   Mood/Affect: Normal         Dilation     Both eyes: 1.0% Mydriacyl, 2.5% Phenylephrine @ 8:00 AM           Slit Lamp and Fundus Exam     Slit Lamp Exam       Right Left   Lids/Lashes Dermatochalasis - upper lid Dermatochalasis - upper lid   Conjunctiva/Sclera White and quiet White and quiet   Cornea Trace tear film debris Trace tear film debris   Anterior Chamber deep, clear, narrow temporal angle deep, clear, narrow temporal angle   Iris Round and dilated Round and dilated   Lens  Clear Clear   Anterior Vitreous mild syneresis mild syneresis         Fundus Exam       Right Left   Disc Pink and Sharp, mild PPP Pink and Sharp, mild PPP   C/D Ratio 0.2 0.2   Macula Flat, Good foveal reflex, RPE mottling, No heme or edema Blunted foveal reflex, flame hemes and DBH inferior macula extending to IT arcades -- improved, +edema centrally -- improved   Vessels attenuated, Tortuous attenuated, Tortuous -- IT BRVO   Periphery Attached Attached           Refraction     Wearing Rx       Sphere Cylinder Axis   Right +2.75 +0.25 117   Left +3.25 +0.25 060           IMAGING AND PROCEDURES  Imaging and Procedures for 11/28/2021  OCT, Retina - OU - Both Eyes       Right Eye Quality was good. Central Foveal Thickness: 260. Progression has been stable. Findings include normal foveal contour, no IRF, no SRF, vitreomacular adhesion .   Left Eye Quality was good. Central Foveal Thickness: 270. Progression has improved. Findings include normal foveal contour, no SRF, intraretinal fluid, vitreomacular adhesion (interval improvement in IRF IT fovea and  mac).   Notes *Images captured and stored on drive  Diagnosis / Impression:  OD: NFP, no IRF/SRF OS: BRVO w/ CME -- interval improvement in IRF IT fovea and mac  Clinical management:  See below  Abbreviations: NFP - Normal foveal profile. CME - cystoid macular edema. PED - pigment epithelial detachment. IRF - intraretinal fluid. SRF - subretinal fluid. EZ - ellipsoid zone. ERM - epiretinal membrane. ORA - outer retinal atrophy. ORT - outer retinal tubulation. SRHM - subretinal hyper-reflective material. IRHM - intraretinal hyper-reflective material      Intravitreal Injection, Pharmacologic Agent - OS - Left Eye       Time Out 11/28/2021. 8:08 AM. Confirmed correct patient, procedure, site, and patient consented.   Anesthesia Topical anesthesia was used. Anesthetic medications included Lidocaine 2%, Proparacaine 0.5%.   Procedure Preparation included 5% betadine to ocular surface, eyelid speculum. A (32g) needle was used.   Injection: 1.25 mg Bevacizumab 1.45m/0.05ml   Route: Intravitreal, Site: Left Eye   NDC: 5H061816 Lot:: [id number] Expiration date: 02/11/2022   Post-op Post injection exam found visual acuity of at least counting fingers. The patient tolerated the procedure well. There were no complications. The patient received written and verbal post procedure care education. Post injection medications were not given.            ASSESSMENT/PLAN:    ICD-10-CM   1. Branch retinal vein occlusion of left eye with macular edema  H34.8320 OCT, Retina - OU - Both Eyes    Intravitreal Injection, Pharmacologic Agent - OS - Left Eye    Bevacizumab (AVASTIN) SOLN 1.25 mg     BRVO w/ CME OS  - s/p IVA OS #1 (06.07.23) - FA (06.02.23) shows focal BRVO inferior macula - originally, pt asymptomatic and didn't realize she had an issue until her routine eye exam - BCVA improved to 20/20 from 20/25 - - OCT shows interval improvement in IRF - recommend IVA OS #2 today,  07.06.23 - pt wishes to proceed with injection - RBA of procedure discussed, questions answered - IVA informed consent obtained and signed, OS (06.07.23) - see procedure note - f/u 4 weeks, DFE, OCT  Ophthalmic Meds Ordered this visit:  Meds ordered this encounter  Medications   Bevacizumab (AVASTIN) SOLN 1.25 mg     Return in about 4 weeks (around 12/26/2021) for f/u BRVO OS, DFE, OCT.  There are no Patient Instructions on file for this visit.  Explained the diagnoses, plan, and follow up with the patient and they expressed understanding.  Patient expressed understanding of the importance of proper follow up care.   This document serves as a record of services personally performed by Gardiner Sleeper, MD, PhD. It was created on their behalf by San Jetty. Owens Shark, OA an ophthalmic technician. The creation of this record is the provider's dictation and/or activities during the visit.    Electronically signed by: San Jetty. Owens Shark, [place] 06.22.2023 12:07 PM   Gardiner Sleeper, M.D., Ph.D. Diseases & Surgery of the Retina and Vitreous [facility]  I have reviewed the above documentation for accuracy and completeness, and I agree with the above. Gardiner Sleeper, M.D., Ph.D. 11/28/21 12:13 PM   Abbreviations: M myopia (nearsighted); A astigmatism; H hyperopia (farsighted); P presbyopia; Mrx spectacle prescription;  CTL contact lenses; OD right eye; OS left eye; OU both eyes  XT exotropia; ET esotropia; PEK punctate epithelial keratitis; PEE punctate epithelial erosions; DES dry eye syndrome; MGD meibomian gland dysfunction; ATs artificial tears; PFAT's preservative free artificial tears; [place] nuclear sclerotic cataract; PSC posterior subcapsular cataract; ERM epi-retinal membrane; PVD posterior vitreous detachment; RD retinal detachment; DM diabetes mellitus; DR diabetic retinopathy; NPDR non-proliferative diabetic retinopathy; PDR proliferative diabetic retinopathy; CSME  clinically significant macular edema; DME diabetic macular edema; dbh dot blot hemorrhages; CWS cotton wool spot; POAG primary open angle glaucoma; C/D cup-to-disc ratio; HVF humphrey visual field; GVF goldmann visual field; OCT optical coherence tomography; IOP intraocular pressure; BRVO Branch retinal vein occlusion; CRVO central retinal vein occlusion; CRAO central retinal artery occlusion; BRAO branch retinal artery occlusion; RT retinal tear; SB scleral buckle; PPV pars plana vitrectomy; VH Vitreous hemorrhage; PRP panretinal laser photocoagulation; IVK intravitreal kenalog; VMT vitreomacular traction; MH Macular hole;  NVD neovascularization of the disc; NVE neovascularization elsewhere; AREDS age related eye disease study; ARMD age related macular degeneration; POAG primary open angle glaucoma; EBMD epithelial/anterior basement membrane dystrophy; ACIOL anterior chamber intraocular lens; IOL intraocular lens; PCIOL posterior chamber intraocular lens; Phaco/IOL phacoemulsification with intraocular lens placement; [place] photorefractive keratectomy; LASIK laser assisted in situ keratomileusis; HTN hypertension; DM diabetes mellitus; COPD chronic obstructive pulmonary disease

## 2021-11-28 ENCOUNTER — Ambulatory Visit (INDEPENDENT_AMBULATORY_CARE_PROVIDER_SITE_OTHER): Payer: BC Managed Care – PPO | Admitting: Ophthalmology

## 2021-11-28 ENCOUNTER — Encounter (INDEPENDENT_AMBULATORY_CARE_PROVIDER_SITE_OTHER): Payer: Self-pay | Admitting: Ophthalmology

## 2021-11-28 DIAGNOSIS — H34832 Tributary (branch) retinal vein occlusion, left eye, with macular edema: Secondary | ICD-10-CM

## 2021-11-28 MED ORDER — BEVACIZUMAB CHEMO INJECTION 1.25MG/0.05ML SYRINGE FOR KALEIDOSCOPE
1.2500 mg | INTRAVITREAL | Status: AC | PRN
  Administered 2021-11-28: 1.25 mg via INTRAVITREAL

## 2021-12-23 NOTE — Progress Notes (Addendum)
[facility] Note  12/27/2021     CHIEF COMPLAINT Patient presents for Retina Follow Up   HISTORY OF PRESENT ILLNESS: Sonya Anderson is a 56 y.o. female who presents to the clinic today for:   HPI     Retina Follow Up   Patient presents with  CRVO/BRVO.  In left eye.  This started 4 weeks ago.  I, the attending physician,  performed the HPI with the patient and updated documentation appropriately.        Comments   Patient here for 4 weeks retina follow up for BRVO OS. Patient states vision is ok. Has noticed an improvement. Usually wears CL's and sits at computer all day. No eye pain.       Last edited by [name], MD on 12/27/2021  9:06 AM.     Referring physician: [name], [place], [place] [id number]  HISTORICAL INFORMATION:   Selected notes from the MEDICAL RECORD NUMBER Referred by Dr. Hassell Done for BRVO w/ CME OS LEE:  Ocular Hx- CME and BRVO OS PMH-    CURRENT MEDICATIONS: No current outpatient medications on file. (Ophthalmic Drugs)   No current facility-administered medications for this visit. (Ophthalmic Drugs)   No current outpatient medications on file. (Other)   No current facility-administered medications for this visit. (Other)   REVIEW OF SYSTEMS: ROS   Positive for: Eyes Last edited by Theodore Demark, COA on 12/27/2021  8:26 AM.     ALLERGIES No Known Allergies  PAST MEDICAL HISTORY History reviewed. No pertinent past medical history. History reviewed. No pertinent surgical history.  FAMILY HISTORY History reviewed. No pertinent family history.  SOCIAL HISTORY Social History   Tobacco Use   Smoking status: Never   Smokeless tobacco: Never  Vaping Use   Vaping Use: Never used  Substance Use Topics   Alcohol use: Never   Drug use: Never       OPHTHALMIC EXAM:  Base Eye Exam     Visual Acuity (Snellen - Linear)       Right Left   Dist cc 20/20 20/20    Correction: Glasses          Tonometry (Tonopen, 8:24 AM)       Right Left   Pressure 13 13         Pupils       Dark Light Shape React APD   Right 4 3 Round Brisk None   Left 4 3 Round Brisk None         Visual Fields (Counting fingers)       Left Right    Full Full         Extraocular Movement       Right Left    Full, Ortho Full, Ortho         Neuro/Psych     Oriented x3: Yes   Mood/Affect: Normal         Dilation     Both eyes: 1.0% Mydriacyl, 2.5% Phenylephrine @ 8:24 AM           Slit Lamp and Fundus Exam     Slit Lamp Exam       Right Left   Lids/Lashes Dermatochalasis - upper lid Dermatochalasis - upper lid   Conjunctiva/Sclera White and quiet White and quiet   Cornea Trace tear film debris Trace tear film debris   Anterior Chamber deep, clear, narrow temporal angle  deep, clear, narrow temporal angle   Iris Round and dilated Round and dilated   Lens Clear Clear   Anterior Vitreous mild syneresis mild syneresis         Fundus Exam       Right Left   Disc Pink and Sharp, mild PPP Pink and Sharp, mild PPP   C/D Ratio 0.2 0.2   Macula Flat, Good foveal reflex, RPE mottling, No heme or edema Blunted foveal reflex, flame hemes and DBH IT macula -- improved, +edema centrally -- improved   Vessels attenuated, Tortuous attenuated, Tortuous -- IT BRVO   Periphery Attached Attached           Refraction     Wearing Rx       Sphere Cylinder Axis   Right +2.75 +0.25 117   Left +3.25 +0.25 060           IMAGING AND PROCEDURES  Imaging and Procedures for 12/27/2021  OCT, Retina - OU - Both Eyes       Right Eye Quality was good. Central Foveal Thickness: 263. Progression has been stable. Findings include normal foveal contour, no IRF, no SRF, vitreomacular adhesion .   Left Eye Quality was good. Central Foveal Thickness: 267. Progression has improved. Findings include normal foveal contour, no SRF, intraretinal fluid, vitreomacular adhesion  (interval improvement in IRF IT mac, just focal cystic changes remain).   Notes *Images captured and stored on drive  Diagnosis / Impression:  OD: NFP, no IRF/SRF OS: BRVO w/ CME -- interval improvement in IRF IT mac, just focal cystic changes remain  Clinical management:  See below  Abbreviations: NFP - Normal foveal profile. CME - cystoid macular edema. PED - pigment epithelial detachment. IRF - intraretinal fluid. SRF - subretinal fluid. EZ - ellipsoid zone. ERM - epiretinal membrane. ORA - outer retinal atrophy. ORT - outer retinal tubulation. SRHM - subretinal hyper-reflective material. IRHM - intraretinal hyper-reflective material      Intravitreal Injection, Pharmacologic Agent - OS - Left Eye       Time Out 12/27/2021. 8:34 AM. Confirmed correct patient, procedure, site, and patient consented.   Anesthesia Topical anesthesia was used. Anesthetic medications included Lidocaine 2%, Proparacaine 0.5%.   Procedure Preparation included 5% betadine to ocular surface, eyelid speculum. A (32g) needle was used.   Injection: 1.25 mg Bevacizumab 1.24m/0.05ml   Route: Intravitreal, Site: Left Eye   NDC: [id number] Lot:: [id number] Expiration date: 02/11/2022   Post-op Post injection exam found visual acuity of at least counting fingers. The patient tolerated the procedure well. There were no complications. The patient received written and verbal post procedure care education. Post injection medications were not given.            ASSESSMENT/PLAN:    ICD-10-CM   1. Branch retinal vein occlusion of left eye with macular edema  H34.8320 OCT, Retina - OU - Both Eyes    Intravitreal Injection, Pharmacologic Agent - OS - Left Eye    Bevacizumab (AVASTIN) SOLN 1.25 mg     BRVO w/ CME OS  - s/p IVA OS #1 (06.07.23), #2 (07.06.23) - FA (06.02.23) shows focal BRVO inferior macula - originally, pt asymptomatic and didn't realize she had an issue until her routine eye exam - BCVA OS  20/20 -- stable - OCT shows interval improvement in IRF IT mac, just focal cystic changes remain - recommend IVA OS #3 today, 08.04.23 - pt wishes to proceed with injection - RBA of procedure discussed, questions  answered - IVA informed consent obtained and signed, OS (06.07.23) - see procedure note - f/u 4 weeks, DFE, OCT, possible injxn   Ophthalmic Meds Ordered this visit:  Meds ordered this encounter  Medications   Bevacizumab (AVASTIN) SOLN 1.25 mg     Return in about 4 weeks (around 01/24/2022) for BRVO OS, Dilated Exam, OCT, Possible Injxn.  There are no Patient Instructions on file for this visit.  Explained the diagnoses, plan, and follow up with the patient and they expressed understanding.  Patient expressed understanding of the importance of proper follow up care.   This document serves as a record of services personally performed by Gardiner Sleeper, MD, PhD. It was created on their behalf by San Jetty. Owens Shark, OA an ophthalmic technician. The creation of this record is the provider's dictation and/or activities during the visit.    Electronically signed by: San Jetty. [place], [place] 07.31.2023 1:06 PM  Gardiner Sleeper, M.D., Ph.D. Diseases & Surgery of the Retina and Vitreous [facility]  I have reviewed the above documentation for accuracy and completeness, and I agree with the above. Gardiner Sleeper, M.D., Ph.D. 12/27/21 1:06 PM  Abbreviations: M myopia (nearsighted); A astigmatism; H hyperopia (farsighted); P presbyopia; Mrx spectacle prescription;  CTL contact lenses; OD right eye; OS left eye; OU both eyes  XT exotropia; ET esotropia; PEK punctate epithelial keratitis; PEE punctate epithelial erosions; DES dry eye syndrome; MGD meibomian gland dysfunction; ATs artificial tears; PFAT's preservative free artificial tears; [place] nuclear sclerotic cataract; PSC posterior subcapsular cataract; ERM epi-retinal membrane; PVD posterior vitreous detachment; RD  retinal detachment; DM diabetes mellitus; DR diabetic retinopathy; NPDR non-proliferative diabetic retinopathy; PDR proliferative diabetic retinopathy; CSME clinically significant macular edema; DME diabetic macular edema; dbh dot blot hemorrhages; CWS cotton wool spot; POAG primary open angle glaucoma; C/D cup-to-disc ratio; HVF humphrey visual field; GVF goldmann visual field; OCT optical coherence tomography; IOP intraocular pressure; BRVO Branch retinal vein occlusion; CRVO central retinal vein occlusion; CRAO central retinal artery occlusion; BRAO branch retinal artery occlusion; RT retinal tear; SB scleral buckle; PPV pars plana vitrectomy; VH Vitreous hemorrhage; PRP panretinal laser photocoagulation; IVK intravitreal kenalog; VMT vitreomacular traction; MH Macular hole;  NVD neovascularization of the disc; NVE neovascularization elsewhere; AREDS age related eye disease study; ARMD age related macular degeneration; POAG primary open angle glaucoma; EBMD epithelial/anterior basement membrane dystrophy; ACIOL anterior chamber intraocular lens; IOL intraocular lens; PCIOL posterior chamber intraocular lens; Phaco/IOL phacoemulsification with intraocular lens placement; [place] photorefractive keratectomy; LASIK laser assisted in situ keratomileusis; HTN hypertension; DM diabetes mellitus; COPD chronic obstructive pulmonary disease

## 2021-12-27 ENCOUNTER — Ambulatory Visit (INDEPENDENT_AMBULATORY_CARE_PROVIDER_SITE_OTHER): Payer: BC Managed Care – PPO | Admitting: Ophthalmology

## 2021-12-27 ENCOUNTER — Encounter (INDEPENDENT_AMBULATORY_CARE_PROVIDER_SITE_OTHER): Payer: Self-pay | Admitting: Ophthalmology

## 2021-12-27 DIAGNOSIS — H34832 Tributary (branch) retinal vein occlusion, left eye, with macular edema: Secondary | ICD-10-CM

## 2021-12-27 DIAGNOSIS — H35033 Hypertensive retinopathy, bilateral: Secondary | ICD-10-CM | POA: Diagnosis not present

## 2021-12-27 DIAGNOSIS — I10 Essential (primary) hypertension: Secondary | ICD-10-CM | POA: Diagnosis not present

## 2021-12-27 MED ORDER — BEVACIZUMAB CHEMO INJECTION 1.25MG/0.05ML SYRINGE FOR KALEIDOSCOPE
1.2500 mg | INTRAVITREAL | Status: AC | PRN
  Administered 2021-12-27: 1.25 mg via INTRAVITREAL

## 2022-01-20 NOTE — Progress Notes (Signed)
[facility] Note  01/24/2022     CHIEF COMPLAINT Patient presents for Retina Follow Up   HISTORY OF PRESENT ILLNESS: Sonya Anderson is a 56 y.o. female who presents to the clinic today for:   HPI     Retina Follow Up   Patient presents with  CRVO/BRVO.  In left eye.  This started 4 weeks ago.  I, the attending physician,  performed the HPI with the patient and updated documentation appropriately.        Comments   Patient here for 4 weeks retina follow up for BRVO OS. Patient states vision doing good. No eye pain.       Last edited by [name], MD on 01/24/2022 12:06 PM.    Pt feels like vision is improved  Referring physician: [name], [place], [place] [address]  HISTORICAL INFORMATION:   Selected notes from the MEDICAL RECORD NUMBER Referred by Dr. Hassell Done for BRVO w/ CME OS LEE:  Ocular Hx- CME and BRVO OS PMH-    CURRENT MEDICATIONS: No current outpatient medications on file. (Ophthalmic Drugs)   No current facility-administered medications for this visit. (Ophthalmic Drugs)   No current outpatient medications on file. (Other)   No current facility-administered medications for this visit. (Other)   REVIEW OF SYSTEMS: ROS   Positive for: Eyes Last edited by Theodore Demark, COA on 01/24/2022  8:44 AM.     ALLERGIES No Known Allergies  PAST MEDICAL HISTORY History reviewed. No pertinent past medical history. History reviewed. No pertinent surgical history.  FAMILY HISTORY History reviewed. No pertinent family history.  SOCIAL HISTORY Social History   Tobacco Use   Smoking status: Never   Smokeless tobacco: Never  Vaping Use   Vaping Use: Never used  Substance Use Topics   Alcohol use: Never   Drug use: Never       OPHTHALMIC EXAM:  Base Eye Exam     Visual Acuity (Snellen - Linear)       Right Left   Dist cc 20/20 20/20    Correction: Glasses         Tonometry (Tonopen, 8:42  AM)       Right Left   Pressure 10 09         Pupils       Dark Light Shape React APD   Right 4 3 Round Brisk None   Left 4 3 Round Brisk None         Visual Fields (Counting fingers)       Left Right    Full Full         Extraocular Movement       Right Left    Full, Ortho Full, Ortho         Neuro/Psych     Oriented x3: Yes   Mood/Affect: Normal         Dilation     Both eyes: 1.0% Mydriacyl, 2.5% Phenylephrine @ 8:42 AM           Slit Lamp and Fundus Exam     Slit Lamp Exam       Right Left   Lids/Lashes Dermatochalasis - upper lid Dermatochalasis - upper lid   Conjunctiva/Sclera White and quiet White and quiet   Cornea Trace tear film debris Trace tear film debris   Anterior Chamber deep, clear, narrow temporal angle deep, clear, narrow temporal angle   Iris  Round and dilated Round and dilated   Lens Clear Clear   Anterior Vitreous mild syneresis mild syneresis         Fundus Exam       Right Left   Disc Pink and Sharp, mild PPP Pink and Sharp, mild PPP   C/D Ratio 0.2 0.2   Macula Flat, Good foveal reflex, RPE mottling, No heme or edema Blunted foveal reflex, flame hemes and DBH IT macula -- improved, +edema centrally -- improved, persistent cystic changes IT mac   Vessels attenuated, Tortuous attenuated, Tortuous -- IT BRVO   Periphery Attached Attached           Refraction     Wearing Rx       Sphere Cylinder Axis   Right +2.75 +0.25 117   Left +3.25 +0.25 060           IMAGING AND PROCEDURES  Imaging and Procedures for 01/24/2022  OCT, Retina - OU - Both Eyes       Right Eye Quality was good. Central Foveal Thickness: 258. Progression has been stable. Findings include normal foveal contour, no IRF, no SRF, vitreomacular adhesion .   Left Eye Quality was good. Central Foveal Thickness: 257. Progression has been stable. Findings include normal foveal contour, no SRF, intraretinal fluid, vitreomacular adhesion  (Persistent cystic changes IT macula).   Notes *Images captured and stored on drive  Diagnosis / Impression:  OD: NFP, no IRF/SRF OS: BRVO w/ CME -- Persistent cystic changes IT macula  Clinical management:  See below  Abbreviations: NFP - Normal foveal profile. CME - cystoid macular edema. PED - pigment epithelial detachment. IRF - intraretinal fluid. SRF - subretinal fluid. EZ - ellipsoid zone. ERM - epiretinal membrane. ORA - outer retinal atrophy. ORT - outer retinal tubulation. SRHM - subretinal hyper-reflective material. IRHM - intraretinal hyper-reflective material      Intravitreal Injection, Pharmacologic Agent - OS - Left Eye       Time Out 01/24/2022. 9:47 AM. Confirmed correct patient, procedure, site, and patient consented.   Anesthesia Topical anesthesia was used. Anesthetic medications included Lidocaine 2%, Proparacaine 0.5%.   Procedure Preparation included 5% betadine to ocular surface, eyelid speculum. A (32g) needle was used.   Injection: 1.25 mg Bevacizumab 1.69m/0.05ml   Route: Intravitreal, Site: Left Eye   NDC:: 77939-030-09 Lot:: [id number] Expiration date: 03/04/2022   Post-op Post injection exam found visual acuity of at least counting fingers. The patient tolerated the procedure well. There were no complications. The patient received written and verbal post procedure care education. Post injection medications were not given.            ASSESSMENT/PLAN:    ICD-10-CM   1. Branch retinal vein occlusion of left eye with macular edema  H34.8320 OCT, Retina - OU - Both Eyes    Intravitreal Injection, Pharmacologic Agent - OS - Left Eye    Bevacizumab (AVASTIN) SOLN 1.25 mg    2. Essential hypertension  I10     3. Hypertensive retinopathy of both eyes  H35.033      BRVO w/ CME OS  - s/p IVA OS #1 (06.07.23), #2 (07.06.23), #3 (08.04.23) - FA (06.02.23) shows focal BRVO inferior macula - originally, pt asymptomatic and didn't realize she had an  issue until her routine eye exam - BCVA OS 20/20 -- stable - OCT shows persistent cystic changes IT macula at 4 weeks - recommend IVA OS #4 today, 09.01.23 with extension to 5 weeks - pt  wishes to proceed with injection - RBA of procedure discussed, questions answered - IVA informed consent obtained and signed, OS (06.07.23) - see procedure note - f/u 5 weeks, DFE, OCT, possible injxn  Ophthalmic Meds Ordered this visit:  Meds ordered this encounter  Medications   Bevacizumab (AVASTIN) SOLN 1.25 mg     Return in about 5 weeks (around 02/28/2022) for f/u BRVO OS, DFE, OCT.  There are no Patient Instructions on file for this visit.  Explained the diagnoses, plan, and follow up with the patient and they expressed understanding.  Patient expressed understanding of the importance of proper follow up care.   This document serves as a record of services personally performed by Gardiner Sleeper, MD, PhD. It was created on their behalf by San Jetty. Owens Shark, OA an ophthalmic technician. The creation of this record is the provider's dictation and/or activities during the visit.    Electronically signed by: San Jetty. Owens Shark, [place] 08.28.2023 12:09 PM  Gardiner Sleeper, M.D., Ph.D. Diseases & Surgery of the Retina and Vitreous [facility]  I have reviewed the above documentation for accuracy and completeness, and I agree with the above. Gardiner Sleeper, M.D., Ph.D. 01/24/22 12:11 PM  Abbreviations: M myopia (nearsighted); A astigmatism; H hyperopia (farsighted); P presbyopia; Mrx spectacle prescription;  CTL contact lenses; OD right eye; OS left eye; OU both eyes  XT exotropia; ET esotropia; PEK punctate epithelial keratitis; PEE punctate epithelial erosions; DES dry eye syndrome; MGD meibomian gland dysfunction; ATs artificial tears; PFAT's preservative free artificial tears; [place] nuclear sclerotic cataract; PSC posterior subcapsular cataract; ERM epi-retinal membrane; PVD posterior  vitreous detachment; RD retinal detachment; DM diabetes mellitus; DR diabetic retinopathy; NPDR non-proliferative diabetic retinopathy; PDR proliferative diabetic retinopathy; CSME clinically significant macular edema; DME diabetic macular edema; dbh dot blot hemorrhages; CWS cotton wool spot; POAG primary open angle glaucoma; C/D cup-to-disc ratio; HVF humphrey visual field; GVF goldmann visual field; OCT optical coherence tomography; IOP intraocular pressure; BRVO Branch retinal vein occlusion; CRVO central retinal vein occlusion; CRAO central retinal artery occlusion; BRAO branch retinal artery occlusion; RT retinal tear; SB scleral buckle; PPV pars plana vitrectomy; VH Vitreous hemorrhage; PRP panretinal laser photocoagulation; IVK intravitreal kenalog; VMT vitreomacular traction; MH Macular hole;  NVD neovascularization of the disc; NVE neovascularization elsewhere; AREDS age related eye disease study; ARMD age related macular degeneration; POAG primary open angle glaucoma; EBMD epithelial/anterior basement membrane dystrophy; ACIOL anterior chamber intraocular lens; IOL intraocular lens; PCIOL posterior chamber intraocular lens; Phaco/IOL phacoemulsification with intraocular lens placement; [place] photorefractive keratectomy; LASIK laser assisted in situ keratomileusis; HTN hypertension; DM diabetes mellitus; COPD chronic obstructive pulmonary disease

## 2022-01-24 ENCOUNTER — Encounter (INDEPENDENT_AMBULATORY_CARE_PROVIDER_SITE_OTHER): Payer: Self-pay | Admitting: Ophthalmology

## 2022-01-24 ENCOUNTER — Ambulatory Visit (INDEPENDENT_AMBULATORY_CARE_PROVIDER_SITE_OTHER): Payer: BC Managed Care – PPO | Admitting: Ophthalmology

## 2022-01-24 DIAGNOSIS — H35033 Hypertensive retinopathy, bilateral: Secondary | ICD-10-CM | POA: Diagnosis not present

## 2022-01-24 DIAGNOSIS — I10 Essential (primary) hypertension: Secondary | ICD-10-CM | POA: Diagnosis not present

## 2022-01-24 DIAGNOSIS — H34832 Tributary (branch) retinal vein occlusion, left eye, with macular edema: Secondary | ICD-10-CM

## 2022-01-24 MED ORDER — BEVACIZUMAB CHEMO INJECTION 1.25MG/0.05ML SYRINGE FOR KALEIDOSCOPE
1.2500 mg | INTRAVITREAL | Status: AC | PRN
  Administered 2022-01-24: 1.25 mg via INTRAVITREAL

## 2022-02-18 NOTE — Progress Notes (Signed)
[facility] Note  02/28/2022     CHIEF COMPLAINT Patient presents for Retina Follow Up   HISTORY OF PRESENT ILLNESS: Sonya Anderson is a 56 y.o. female who presents to the clinic today for:   HPI     Retina Follow Up   Patient presents with  CRVO/BRVO.  In left eye.  This started 5 weeks ago.  I, the attending physician,  performed the HPI with the patient and updated documentation appropriately.        Comments   Patient here for 5 weeks retina follow up for BRVO OS. Patient states vision doing good. No eye pain.       Last edited by [name], MD on 02/28/2022 12:45 PM.     Pt feels like vision is improved  Referring physician: [name], [place], [place] [address]  HISTORICAL INFORMATION:   Selected notes from the MEDICAL RECORD NUMBER Referred by Dr. Hassell Done for BRVO w/ CME OS LEE:  Ocular Hx- CME and BRVO OS PMH-    CURRENT MEDICATIONS: No current outpatient medications on file. (Ophthalmic Drugs)   No current facility-administered medications for this visit. (Ophthalmic Drugs)   No current outpatient medications on file. (Other)   No current facility-administered medications for this visit. (Other)   REVIEW OF SYSTEMS: ROS   Positive for: Eyes Last edited by Theodore Demark, COA on 02/28/2022  9:05 AM.     ALLERGIES No Known Allergies  PAST MEDICAL HISTORY History reviewed. No pertinent past medical history. History reviewed. No pertinent surgical history.  FAMILY HISTORY History reviewed. No pertinent family history.  SOCIAL HISTORY Social History   Tobacco Use   Smoking status: Never   Smokeless tobacco: Never  Vaping Use   Vaping Use: Never used  Substance Use Topics   Alcohol use: Never   Drug use: Never       OPHTHALMIC EXAM:  Base Eye Exam     Visual Acuity (Snellen - Linear)       Right Left   Dist cc 20/20 20/20    Correction: Glasses         Tonometry (Tonopen,  9:03 AM)       Right Left   Pressure 13 12         Pupils       Dark Light Shape React APD   Right 4 3 Round Brisk None   Left 4 3 Round Brisk None         Visual Fields (Counting fingers)       Left Right    Full Full         Extraocular Movement       Right Left    Full, Ortho Full, Ortho         Neuro/Psych     Oriented x3: Yes   Mood/Affect: Normal         Dilation     Both eyes: 1.0% Mydriacyl, 2.5% Phenylephrine @ 9:03 AM           Slit Lamp and Fundus Exam     Slit Lamp Exam       Right Left   Lids/Lashes Dermatochalasis - upper lid Dermatochalasis - upper lid   Conjunctiva/Sclera White and quiet White and quiet   Cornea Trace tear film debris Trace tear film debris   Anterior Chamber deep, clear, narrow temporal angle deep, clear, narrow temporal angle  Iris Round and dilated Round and dilated   Lens Clear Clear   Anterior Vitreous mild syneresis mild syneresis         Fundus Exam       Right Left   Disc Pink and Sharp, mild PPP Pink and Sharp, mild PPP   C/D Ratio 0.2 0.2   Macula Flat, Good foveal reflex, RPE mottling, No heme or edema Blunted foveal reflex, flame hemes and DBH IT macula -- improved, +edema centrally -- slightly increased, persistent cystic changes IT mac -- slightly increased   Vessels attenuated, Tortuous attenuated, Tortuous -- IT BRVO   Periphery Attached Attached           Refraction     Wearing Rx       Sphere Cylinder Axis   Right +2.75 +0.25 117   Left +3.25 +0.25 060           IMAGING AND PROCEDURES  Imaging and Procedures for 02/28/2022  OCT, Retina - OU - Both Eyes       Right Eye Quality was good. Central Foveal Thickness: 258. Progression has been stable. Findings include normal foveal contour, no IRF, no SRF, vitreomacular adhesion .   Left Eye Quality was good. Central Foveal Thickness: 261. Progression has worsened. Findings include normal foveal contour, no SRF,  intraretinal fluid, vitreomacular adhesion (Mild interval increase in cystic changes IT macula).   Notes *Images captured and stored on drive  Diagnosis / Impression:  OD: NFP, no IRF/SRF OS: BRVO w/ CME -- mild interval increase in cystic changes IT macula  Clinical management:  See below  Abbreviations: NFP - Normal foveal profile. CME - cystoid macular edema. PED - pigment epithelial detachment. IRF - intraretinal fluid. SRF - subretinal fluid. EZ - ellipsoid zone. ERM - epiretinal membrane. ORA - outer retinal atrophy. ORT - outer retinal tubulation. SRHM - subretinal hyper-reflective material. IRHM - intraretinal hyper-reflective material      Intravitreal Injection, Pharmacologic Agent - OS - Left Eye       Time Out 02/28/2022. 9:44 AM. Confirmed correct patient, procedure, site, and patient consented.   Anesthesia Topical anesthesia was used. Anesthetic medications included Lidocaine 2%, Proparacaine 0.5%.   Procedure Preparation included 5% betadine to ocular surface, eyelid speculum. A (32g) needle was used.   Injection: 2 mg aflibercept 2 MG/0.05ML   Route: Intravitreal, Site: Left Eye   NDC: [id number], Lot: [id number], Expiration date: 06/26/2023, Waste: 0 mL   Post-op Post injection exam found visual acuity of at least counting fingers. The patient tolerated the procedure well. There were no complications. The patient received written and verbal post procedure care education. Post injection medications were not given.            ASSESSMENT/PLAN:    ICD-10-CM   1. Branch retinal vein occlusion of left eye with macular edema  H34.8320 OCT, Retina - OU - Both Eyes    Intravitreal Injection, Pharmacologic Agent - OS - Left Eye    aflibercept (EYLEA) SOLN 2 mg     BRVO w/ CME OS  - s/p IVA OS #1 (06.07.23), #2 (07.06.23), #3 (08.04.23), #4 (09.01.23) - FA (06.02.23) shows focal BRVO inferior macula - originally, pt asymptomatic and didn't realize she had  an issue until her routine eye exam - BCVA OS 20/20 -- stable - OCT shows Interval increase in cystic changes IT macula - discussed IVA resistance and potential switch in medication - recommend switched to IVE today due to increase in IRF --  Eylea auth approved - recommend IVE OS # today, 10.06.23 with f/u in 4 week - pt wishes to proceed with IVE injection - RBA of procedure discussed, questions answered - IVA informed consent obtained and signed, OS (06.07.23) - IVE informed consent obtained and signed, OS (10.0.23) - see procedure note - f/u 4 weeks, DFE, OCT, possible injxn  Ophthalmic Meds Ordered this visit:  Meds ordered this encounter  Medications   aflibercept (EYLEA) SOLN 2 mg     Return in about 4 weeks (around 03/28/2022) for f/u BRVO OS, DFE, OCT.  There are no Patient Instructions on file for this visit.  Explained the diagnoses, plan, and follow up with the patient and they expressed understanding.  Patient expressed understanding of the importance of proper follow up care.   This document serves as a record of services personally performed by Gardiner Sleeper, MD, PhD. It was created on their behalf by San Jetty. Owens Shark, OA an ophthalmic technician. The creation of this record is the provider's dictation and/or activities during the visit.    Electronically signed by: San Jetty. Owens Shark, [place] 09.26.2023 1:12 PM   Gardiner Sleeper, M.D., Ph.D. Diseases & Surgery of the Retina and Vitreous [facility]  I have reviewed the above documentation for accuracy and completeness, and I agree with the above. Gardiner Sleeper, M.D., Ph.D. 02/28/22 1:12 PM   Abbreviations: M myopia (nearsighted); A astigmatism; H hyperopia (farsighted); P presbyopia; Mrx spectacle prescription;  CTL contact lenses; OD right eye; OS left eye; OU both eyes  XT exotropia; ET esotropia; PEK punctate epithelial keratitis; PEE punctate epithelial erosions; DES dry eye syndrome; MGD  meibomian gland dysfunction; ATs artificial tears; PFAT's preservative free artificial tears; [place] nuclear sclerotic cataract; PSC posterior subcapsular cataract; ERM epi-retinal membrane; PVD posterior vitreous detachment; RD retinal detachment; DM diabetes mellitus; DR diabetic retinopathy; NPDR non-proliferative diabetic retinopathy; PDR proliferative diabetic retinopathy; CSME clinically significant macular edema; DME diabetic macular edema; dbh dot blot hemorrhages; CWS cotton wool spot; POAG primary open angle glaucoma; C/D cup-to-disc ratio; HVF humphrey visual field; GVF goldmann visual field; OCT optical coherence tomography; IOP intraocular pressure; BRVO Branch retinal vein occlusion; CRVO central retinal vein occlusion; CRAO central retinal artery occlusion; BRAO branch retinal artery occlusion; RT retinal tear; SB scleral buckle; PPV pars plana vitrectomy; VH Vitreous hemorrhage; PRP panretinal laser photocoagulation; IVK intravitreal kenalog; VMT vitreomacular traction; MH Macular hole;  NVD neovascularization of the disc; NVE neovascularization elsewhere; AREDS age related eye disease study; ARMD age related macular degeneration; POAG primary open angle glaucoma; EBMD epithelial/anterior basement membrane dystrophy; ACIOL anterior chamber intraocular lens; IOL intraocular lens; PCIOL posterior chamber intraocular lens; Phaco/IOL phacoemulsification with intraocular lens placement; [place] photorefractive keratectomy; LASIK laser assisted in situ keratomileusis; HTN hypertension; DM diabetes mellitus; COPD chronic obstructive pulmonary disease

## 2022-02-28 ENCOUNTER — Ambulatory Visit (INDEPENDENT_AMBULATORY_CARE_PROVIDER_SITE_OTHER): Payer: BC Managed Care – PPO | Admitting: Ophthalmology

## 2022-02-28 ENCOUNTER — Encounter (INDEPENDENT_AMBULATORY_CARE_PROVIDER_SITE_OTHER): Payer: Self-pay | Admitting: Ophthalmology

## 2022-02-28 DIAGNOSIS — H34832 Tributary (branch) retinal vein occlusion, left eye, with macular edema: Secondary | ICD-10-CM

## 2022-02-28 DIAGNOSIS — I10 Essential (primary) hypertension: Secondary | ICD-10-CM

## 2022-02-28 DIAGNOSIS — H35033 Hypertensive retinopathy, bilateral: Secondary | ICD-10-CM

## 2022-02-28 MED ORDER — AFLIBERCEPT 2MG/0.05ML IZ SOLN FOR KALEIDOSCOPE
2.0000 mg | INTRAVITREAL | Status: AC | PRN
  Administered 2022-02-28: 2 mg via INTRAVITREAL

## 2022-03-26 NOTE — Progress Notes (Signed)
[facility] Note  03/31/2022     CHIEF COMPLAINT Patient presents for Retina Follow Up   HISTORY OF PRESENT ILLNESS: Sonya Anderson is a 56 y.o. female who presents to the clinic today for:   HPI     Retina Follow Up   In left eye.  Severity is mild.  Duration of 4 weeks.  Since onset it is stable.  I, the attending physician,  performed the HPI with the patient and updated documentation appropriately.        Comments   4 week Retina eval for Brvo os. Patient states no vision changes      Last edited by [name], MD on 03/31/2022  4:37 PM.      Referring physician: [name], [place], [place] [address]  Pt states   HISTORICAL INFORMATION:   Selected notes from the MEDICAL RECORD NUMBER Referred by Dr. Hassell Done for BRVO w/ CME OS LEE:  Ocular Hx- CME and BRVO OS PMH-    CURRENT MEDICATIONS: No current outpatient medications on file. (Ophthalmic Drugs)   No current facility-administered medications for this visit. (Ophthalmic Drugs)   No current outpatient medications on file. (Other)   No current facility-administered medications for this visit. (Other)   REVIEW OF SYSTEMS: ROS   Positive for: Eyes Negative for: Constitutional, Gastrointestinal, Neurological, Skin, Genitourinary, Musculoskeletal, HENT, Endocrine, Cardiovascular, Respiratory, Psychiatric, Allergic/Imm, Heme/Lymph Last edited by Kingsley Spittle, COT on 03/31/2022  9:55 AM.     ALLERGIES No Known Allergies  PAST MEDICAL HISTORY History reviewed. No pertinent past medical history. History reviewed. No pertinent surgical history.  FAMILY HISTORY History reviewed. No pertinent family history.  SOCIAL HISTORY Social History   Tobacco Use   Smoking status: Never   Smokeless tobacco: Never  Vaping Use   Vaping Use: Never used  Substance Use Topics   Alcohol use: Never   Drug use: Never       OPHTHALMIC EXAM:  Base Eye Exam      Visual Acuity (Snellen - Linear)       Right Left   Dist cc 20/20-1 20/20    Correction: Glasses         Tonometry (Tonopen, 9:08 AM)       Right Left   Pressure 13 13         Pupils       Dark Light Shape React APD   Right 3 2 Round Brisk None   Left 3 2 Round Brisk None         Visual Fields (Counting fingers)       Left Right    Full Full         Extraocular Movement       Right Left    Full, Ortho Full, Ortho         Neuro/Psych     Oriented x3: Yes   Mood/Affect: Normal         Dilation     Both eyes: 1.0% Mydriacyl, 2.5% Phenylephrine @ 9:07 AM           Slit Lamp and Fundus Exam     Slit Lamp Exam       Right Left   Lids/Lashes Dermatochalasis - upper lid Dermatochalasis - upper lid   Conjunctiva/Sclera White and quiet White and quiet   Cornea Trace tear film debris Trace tear film debris   Anterior Chamber deep, clear, narrow  temporal angle deep, clear, narrow temporal angle   Iris Round and dilated Round and dilated   Lens Clear Clear   Anterior Vitreous mild syneresis mild syneresis         Fundus Exam       Right Left   Disc Pink and Sharp, mild PPP Pink and Sharp, mild PPP   C/D Ratio 0.2 0.2   Macula Flat, Good foveal reflex, RPE mottling, No heme or edema Blunted foveal reflex, flame hemes and DBH IT macula -- improved, +edema centrally -- improved, interval improvement in cystic changes IT mac   Vessels attenuated, Tortuous attenuated, Tortuous -- IT BRVO   Periphery Attached Attached           Refraction     Wearing Rx       Sphere Cylinder Axis   Right +2.75 +0.25 117   Left +3.25 +0.25 060           IMAGING AND PROCEDURES  Imaging and Procedures for 03/31/2022  OCT, Retina - OU - Both Eyes       Right Eye Quality was good. Central Foveal Thickness: 261. Progression has been stable. Findings include normal foveal contour, no IRF, no SRF, vitreomacular adhesion .   Left Eye Quality was  good. Central Foveal Thickness: 262. Progression has improved. Findings include normal foveal contour, no IRF, no SRF, vitreomacular adhesion (interval improvement / resolution of cystic changes IT macula).   Notes *Images captured and stored on drive  Diagnosis / Impression:  OD: NFP, no IRF/SRF OS: BRVO w/ CME -- interval improvement / resolution of cystic changes IT macula  Clinical management:  See below  Abbreviations: NFP - Normal foveal profile. CME - cystoid macular edema. PED - pigment epithelial detachment. IRF - intraretinal fluid. SRF - subretinal fluid. EZ - ellipsoid zone. ERM - epiretinal membrane. ORA - outer retinal atrophy. ORT - outer retinal tubulation. SRHM - subretinal hyper-reflective material. IRHM - intraretinal hyper-reflective material      Intravitreal Injection, Pharmacologic Agent - OS - Left Eye       Time Out 03/31/2022. 10:05 AM. Confirmed correct patient, procedure, site, and patient consented.   Anesthesia Topical anesthesia was used. Anesthetic medications included Lidocaine 2%, Proparacaine 0.5%.   Procedure Preparation included 5% betadine to ocular surface, eyelid speculum. A (32g) needle was used.   Injection: 2 mg aflibercept 2 MG/0.05ML   Route: Intravitreal, Site: Left Eye   NDC: [id number], Lot: [id number], Expiration date: 05/27/2023, Waste: 0 mL   Post-op Post injection exam found visual acuity of at least counting fingers. The patient tolerated the procedure well. There were no complications. The patient received written and verbal post procedure care education. Post injection medications were not given.            ASSESSMENT/PLAN:    ICD-10-CM   1. Branch retinal vein occlusion of left eye with macular edema  H34.8320 OCT, Retina - OU - Both Eyes    Intravitreal Injection, Pharmacologic Agent - OS - Left Eye    aflibercept (EYLEA) SOLN 2 mg     BRVO w/ CME OS  - s/p IVA OS #1 (06.07.23), #2 (07.06.23), #3 (08.04.23), #4  (09.01.23) -- IVA resistance  - s/p IVE OS #1 (10.06.23) - FA (06.02.23) shows focal BRVO inferior macula - originally, pt asymptomatic and didn't realize she had an issue until her routine eye exam - BCVA OS 20/20 -- stable - OCT shows Interval improvement / resolution of cystic changes IT  macula - recommend IVE OS #2 today, 11.06.23 with f/u in 4 week - pt wishes to proceed with IVE injection - RBA of procedure discussed, questions answered - IVA informed consent obtained and signed, OS (06.07.23) - IVE informed consent obtained and signed, OS (10.0.23) - see procedure note - f/u 4 weeks, DFE, OCT, possible injxn  Ophthalmic Meds Ordered this visit:  Meds ordered this encounter  Medications   aflibercept (EYLEA) SOLN 2 mg     Return in about 4 weeks (around 04/28/2022) for f/u BRVO OS, DFE, OCT.  There are no Patient Instructions on file for this visit.  Explained the diagnoses, plan, and follow up with the patient and they expressed understanding.  Patient expressed understanding of the importance of proper follow up care.   This document serves as a record of services personally performed by Gardiner Sleeper, MD, PhD. It was created on their behalf by Roselee Nova, COMT. The creation of this record is the provider's dictation and/or activities during the visit.  Electronically signed by: Roselee Nova, COMT 03/31/22 4:38 PM  Gardiner Sleeper, M.D., Ph.D. Diseases & Surgery of the Retina and Vitreous [facility]  I have reviewed the above documentation for accuracy and completeness, and I agree with the above. Gardiner Sleeper, M.D., Ph.D. 03/31/22 4:39 PM   Abbreviations: M myopia (nearsighted); A astigmatism; H hyperopia (farsighted); P presbyopia; Mrx spectacle prescription;  CTL contact lenses; OD right eye; OS left eye; OU both eyes  XT exotropia; ET esotropia; PEK punctate epithelial keratitis; PEE punctate epithelial erosions; DES dry eye syndrome;  MGD meibomian gland dysfunction; ATs artificial tears; PFAT's preservative free artificial tears; [place] nuclear sclerotic cataract; PSC posterior subcapsular cataract; ERM epi-retinal membrane; PVD posterior vitreous detachment; RD retinal detachment; DM diabetes mellitus; DR diabetic retinopathy; NPDR non-proliferative diabetic retinopathy; PDR proliferative diabetic retinopathy; CSME clinically significant macular edema; DME diabetic macular edema; dbh dot blot hemorrhages; CWS cotton wool spot; POAG primary open angle glaucoma; C/D cup-to-disc ratio; HVF humphrey visual field; GVF goldmann visual field; OCT optical coherence tomography; IOP intraocular pressure; BRVO Branch retinal vein occlusion; CRVO central retinal vein occlusion; CRAO central retinal artery occlusion; BRAO branch retinal artery occlusion; RT retinal tear; SB scleral buckle; PPV pars plana vitrectomy; VH Vitreous hemorrhage; PRP panretinal laser photocoagulation; IVK intravitreal kenalog; VMT vitreomacular traction; MH Macular hole;  NVD neovascularization of the disc; NVE neovascularization elsewhere; AREDS age related eye disease study; ARMD age related macular degeneration; POAG primary open angle glaucoma; EBMD epithelial/anterior basement membrane dystrophy; ACIOL anterior chamber intraocular lens; IOL intraocular lens; PCIOL posterior chamber intraocular lens; Phaco/IOL phacoemulsification with intraocular lens placement; [place] photorefractive keratectomy; LASIK laser assisted in situ keratomileusis; HTN hypertension; DM diabetes mellitus; COPD chronic obstructive pulmonary disease

## 2022-03-31 ENCOUNTER — Encounter (INDEPENDENT_AMBULATORY_CARE_PROVIDER_SITE_OTHER): Payer: Self-pay | Admitting: Ophthalmology

## 2022-03-31 ENCOUNTER — Ambulatory Visit (INDEPENDENT_AMBULATORY_CARE_PROVIDER_SITE_OTHER): Payer: BC Managed Care – PPO | Admitting: Ophthalmology

## 2022-03-31 DIAGNOSIS — H34832 Tributary (branch) retinal vein occlusion, left eye, with macular edema: Secondary | ICD-10-CM

## 2022-03-31 MED ORDER — AFLIBERCEPT 2MG/0.05ML IZ SOLN FOR KALEIDOSCOPE
2.0000 mg | INTRAVITREAL | Status: AC | PRN
  Administered 2022-03-31: 2 mg via INTRAVITREAL

## 2022-04-25 NOTE — Progress Notes (Signed)
[facility] Note  04/28/2022     CHIEF COMPLAINT Patient presents for Retina Follow Up   HISTORY OF PRESENT ILLNESS: Sonya Anderson is a 56 y.o. female who presents to the clinic today for:   HPI     Retina Follow Up   In left eye.  This started months ago.  Severity is mild.  Duration of 4 weeks.  Since onset it is stable.  I, the attending physician,  performed the HPI with the patient and updated documentation appropriately.        Comments   Patient feels that the vision is the same and is not getting worse. She is not using any eye drops at this time.       Last edited by [name], MD on 04/28/2022 12:26 PM.    Pt states she fell last week, she was wearing her glasses at the time, but she had a black eye afterwards  Referring physician: [name], [place], [place] [address]  Pt states   HISTORICAL INFORMATION:   Selected notes from the MEDICAL RECORD NUMBER Referred by Dr. Hassell Done for BRVO w/ CME OS LEE:  Ocular Hx- CME and BRVO OS PMH-    CURRENT MEDICATIONS: No current outpatient medications on file. (Ophthalmic Drugs)   No current facility-administered medications for this visit. (Ophthalmic Drugs)   No current outpatient medications on file. (Other)   No current facility-administered medications for this visit. (Other)   REVIEW OF SYSTEMS: ROS   Positive for: Eyes Negative for: Constitutional, Gastrointestinal, Neurological, Skin, Genitourinary, Musculoskeletal, HENT, Endocrine, Cardiovascular, Respiratory, Psychiatric, Allergic/Imm, Heme/Lymph Last edited by Annie Paras, COT on 04/28/2022  9:00 AM.     ALLERGIES No Known Allergies  PAST MEDICAL HISTORY History reviewed. No pertinent past medical history. History reviewed. No pertinent surgical history.  FAMILY HISTORY History reviewed. No pertinent family history.  SOCIAL HISTORY Social History   Tobacco Use   Smoking status: Never    Smokeless tobacco: Never  Vaping Use   Vaping Use: Never used  Substance Use Topics   Alcohol use: Never   Drug use: Never       OPHTHALMIC EXAM:  Base Eye Exam     Visual Acuity (Snellen - Linear)       Right Left   Dist cc 20/20 20/20    Correction: Glasses         Tonometry (Tonopen, 9:03 AM)       Right Left   Pressure 14 15         Pupils       Dark Light Shape React APD   Right 3 2 Round Brisk None   Left 3 2 Round Brisk None         Visual Fields       Left Right    Full Full         Extraocular Movement       Right Left    Full, Ortho Full, Ortho         Neuro/Psych     Oriented x3: Yes   Mood/Affect: Normal         Dilation     Both eyes: 1.0% Mydriacyl, 2.5% Phenylephrine @ 9:00 AM           Slit Lamp and Fundus Exam     Slit Lamp Exam       Right Left   Lids/Lashes Dermatochalasis -  upper lid Dermatochalasis - upper lid   Conjunctiva/Sclera White and quiet White and quiet   Cornea Trace tear film debris Trace tear film debris   Anterior Chamber deep, clear, narrow temporal angle deep, clear, narrow temporal angle   Iris Round and dilated Round and dilated   Lens Clear Clear   Anterior Vitreous mild syneresis mild syneresis         Fundus Exam       Right Left   Disc Pink and Sharp, mild PPP Pink and Sharp, mild PPP   C/D Ratio 0.2 0.2   Macula Flat, Good foveal reflex, RPE mottling, focal drusen IT to fovea, No heme or edema Blunted foveal reflex, flame hemes and DBH IT macula -- stably improved, stable improvement in cystic changes IT and central mac   Vessels attenuated, Tortuous attenuated, Tortuous -- IT BRVO   Periphery Attached Attached           Refraction     Wearing Rx       Sphere Cylinder Axis   Right +2.75 +0.25 117   Left +3.25 +0.25 060           IMAGING AND PROCEDURES  Imaging and Procedures for 04/28/2022  OCT, Retina - OU - Both Eyes       Right Eye Quality was  good. Central Foveal Thickness: 270. Progression has been stable. Findings include normal foveal contour, no IRF, no SRF, vitreomacular adhesion (Rare drusen).   Left Eye Quality was good. Central Foveal Thickness: 262. Progression has improved. Findings include normal foveal contour, no IRF, no SRF, vitreomacular adhesion (stablel improvement / resolution of cystic changes IT macula).   Notes *Images captured and stored on drive  Diagnosis / Impression:  OD: NFP, no IRF/SRF OS: BRVO w/ CME -- stable improvement / resolution of cystic changes IT macula  Clinical management:  See below  Abbreviations: NFP - Normal foveal profile. CME - cystoid macular edema. PED - pigment epithelial detachment. IRF - intraretinal fluid. SRF - subretinal fluid. EZ - ellipsoid zone. ERM - epiretinal membrane. ORA - outer retinal atrophy. ORT - outer retinal tubulation. SRHM - subretinal hyper-reflective material. IRHM - intraretinal hyper-reflective material      Intravitreal Injection, Pharmacologic Agent - OS - Left Eye       Time Out 04/28/2022. 9:37 AM. Confirmed correct patient, procedure, site, and patient consented.   Anesthesia Topical anesthesia was used. Anesthetic medications included Lidocaine 2%, Proparacaine 0.5%.   Procedure Preparation included 5% betadine to ocular surface, eyelid speculum. A (32g) needle was used.   Injection: 2 mg aflibercept 2 MG/0.05ML   Route: Intravitreal, Site: Left Eye   NDC: [id number], Lot: [id number], Expiration date: 07/24/2023, Waste: 0 mL   Post-op Post injection exam found visual acuity of at least counting fingers. The patient tolerated the procedure well. There were no complications. The patient received written and verbal post procedure care education. Post injection medications were not given.            ASSESSMENT/PLAN:    ICD-10-CM   1. Branch retinal vein occlusion of left eye with macular edema  H34.8320 OCT, Retina - OU - Both Eyes     Intravitreal Injection, Pharmacologic Agent - OS - Left Eye    aflibercept (EYLEA) SOLN 2 mg    2. Essential hypertension  I10     3. Hypertensive retinopathy of both eyes  H35.033      BRVO w/ CME OS  - s/p IVA OS #  1 (06.07.23), #2 (07.06.23), #3 (08.04.23), #4 (09.01.23) -- IVA resistance  - s/p IVE OS #1 (10.06.23), #2 (11.06.23) - FA (06.02.23) shows focal BRVO inferior macula - originally, pt asymptomatic and didn't realize she had an issue until her routine eye exam - BCVA OS 20/20 -- stable - OCT shows stable improvement / resolution of cystic changes IT macula at 4 weeks - recommend IVE OS #3 today, 12.04.23 with f/u extended to 5 weeks - pt wishes to proceed with IVE injection - RBA of procedure discussed, questions answered - IVA informed consent obtained and signed, OS (06.07.23) - IVE informed consent obtained and signed, OS (10.0.23) - see procedure note - f/u 5 weeks, DFE, OCT, possible injxn  Ophthalmic Meds Ordered this visit:  Meds ordered this encounter  Medications   aflibercept (EYLEA) SOLN 2 mg     Return in about 5 weeks (around 06/02/2022) for f/u BRVO OS, DFE, OCT.  There are no Patient Instructions on file for this visit.  Explained the diagnoses, plan, and follow up with the patient and they expressed understanding.  Patient expressed understanding of the importance of proper follow up care.   This document serves as a record of services personally performed by Gardiner Sleeper, MD, PhD. It was created on their behalf by Roselee Nova, COMT. The creation of this record is the provider's dictation and/or activities during the visit.  Electronically signed by: Roselee Nova, COMT 04/28/22 12:27 PM  This document serves as a record of services personally performed by Gardiner Sleeper, MD, PhD. It was created on their behalf by San Jetty. Owens Shark, OA an ophthalmic technician. The creation of this record is the provider's dictation and/or activities during the  visit.    Electronically signed by: San Jetty. Marguerita Merles 12.04.2023 12:27 PM  Gardiner Sleeper, M.D., Ph.D. Diseases & Surgery of the Retina and Vitreous [facility]  I have reviewed the above documentation for accuracy and completeness, and I agree with the above. Gardiner Sleeper, M.D., Ph.D. 04/28/22 12:27 PM  Abbreviations: M myopia (nearsighted); A astigmatism; H hyperopia (farsighted); P presbyopia; Mrx spectacle prescription;  CTL contact lenses; OD right eye; OS left eye; OU both eyes  XT exotropia; ET esotropia; PEK punctate epithelial keratitis; PEE punctate epithelial erosions; DES dry eye syndrome; MGD meibomian gland dysfunction; ATs artificial tears; PFAT's preservative free artificial tears; [place] nuclear sclerotic cataract; PSC posterior subcapsular cataract; ERM epi-retinal membrane; PVD posterior vitreous detachment; RD retinal detachment; DM diabetes mellitus; DR diabetic retinopathy; NPDR non-proliferative diabetic retinopathy; PDR proliferative diabetic retinopathy; CSME clinically significant macular edema; DME diabetic macular edema; dbh dot blot hemorrhages; CWS cotton wool spot; POAG primary open angle glaucoma; C/D cup-to-disc ratio; HVF humphrey visual field; GVF goldmann visual field; OCT optical coherence tomography; IOP intraocular pressure; BRVO Branch retinal vein occlusion; CRVO central retinal vein occlusion; CRAO central retinal artery occlusion; BRAO branch retinal artery occlusion; RT retinal tear; SB scleral buckle; PPV pars plana vitrectomy; VH Vitreous hemorrhage; PRP panretinal laser photocoagulation; IVK intravitreal kenalog; VMT vitreomacular traction; MH Macular hole;  NVD neovascularization of the disc; NVE neovascularization elsewhere; AREDS age related eye disease study; ARMD age related macular degeneration; POAG primary open angle glaucoma; EBMD epithelial/anterior basement membrane dystrophy; ACIOL anterior chamber intraocular lens; IOL  intraocular lens; PCIOL posterior chamber intraocular lens; Phaco/IOL phacoemulsification with intraocular lens placement; [place] photorefractive keratectomy; LASIK laser assisted in situ keratomileusis; HTN hypertension; DM diabetes mellitus; COPD chronic obstructive pulmonary disease

## 2022-04-28 ENCOUNTER — Encounter (INDEPENDENT_AMBULATORY_CARE_PROVIDER_SITE_OTHER): Payer: Self-pay | Admitting: Ophthalmology

## 2022-04-28 ENCOUNTER — Ambulatory Visit (INDEPENDENT_AMBULATORY_CARE_PROVIDER_SITE_OTHER): Payer: BC Managed Care – PPO | Admitting: Ophthalmology

## 2022-04-28 DIAGNOSIS — H35033 Hypertensive retinopathy, bilateral: Secondary | ICD-10-CM

## 2022-04-28 DIAGNOSIS — H34832 Tributary (branch) retinal vein occlusion, left eye, with macular edema: Secondary | ICD-10-CM | POA: Diagnosis not present

## 2022-04-28 DIAGNOSIS — I10 Essential (primary) hypertension: Secondary | ICD-10-CM

## 2022-04-28 MED ORDER — AFLIBERCEPT 2MG/0.05ML IZ SOLN FOR KALEIDOSCOPE
2.0000 mg | INTRAVITREAL | Status: AC | PRN
  Administered 2022-04-28: 2 mg via INTRAVITREAL

## 2022-05-28 NOTE — Progress Notes (Signed)
[facility] Note  06/02/2022     CHIEF COMPLAINT Patient presents for Retina Follow Up   HISTORY OF PRESENT ILLNESS: Sonya Anderson is a 57 y.o. female who presents to the clinic today for:   HPI     Retina Follow Up   Patient presents with  CRVO/BRVO.  This started 5 weeks ago.  Duration of 5 weeks.  I, the attending physician,  performed the HPI with the patient and updated documentation appropriately.        Comments   5 week retina follow up and I'VE OS pt states no vision changes noticed she denies any flashes or floaters       Last edited by [name], MD on 06/02/2022  3:35 PM.     Referring physician: [name], [place], [place] [id number]  Pt states [facility] the same, no changes.   HISTORICAL INFORMATION:   Selected notes from the MEDICAL RECORD NUMBER Referred by Dr. Hassell Done for BRVO w/ CME OS LEE:  Ocular Hx- CME and BRVO OS PMH-    CURRENT MEDICATIONS: No current outpatient medications on file. (Ophthalmic Drugs)   No current facility-administered medications for this visit. (Ophthalmic Drugs)   No current outpatient medications on file. (Other)   No current facility-administered medications for this visit. (Other)   REVIEW OF SYSTEMS: ROS   Positive for: Eyes Last edited by [name], MD on 06/02/2022  3:35 PM.     ALLERGIES No Known Allergies  PAST MEDICAL HISTORY History reviewed. No pertinent past medical history. History reviewed. No pertinent surgical history.  FAMILY HISTORY History reviewed. No pertinent family history.  SOCIAL HISTORY Social History   Tobacco Use   Smoking status: Never   Smokeless tobacco: Never  Vaping Use   Vaping Use: Never used  Substance Use Topics   Alcohol use: Never   Drug use: Never       OPHTHALMIC EXAM:  Base Eye Exam     Visual Acuity (Snellen - Linear)       Right Left   Dist cc 20/20 -1 20/20    Correction: Glasses         Tonometry  (Tonopen, 2:22 PM)       Right Left   Pressure 12 9         Pupils       Pupils Dark Light Shape React APD   Right PERRL 3 2 Round Brisk None   Left PERRL 3 2 Round Brisk None         Extraocular Movement       Right Left    Full, Ortho Full, Ortho         Neuro/Psych     Oriented x3: Yes   Mood/Affect: Normal         Dilation     Both eyes: 2.5% Phenylephrine @ 2:22 PM           Slit Lamp and Fundus Exam     Slit Lamp Exam       Right Left   Lids/Lashes Dermatochalasis - upper lid Dermatochalasis - upper lid   Conjunctiva/Sclera White and quiet White and quiet   Cornea Trace tear film debris Trace tear film debris   Anterior Chamber deep, clear, narrow temporal angle deep, clear, narrow temporal angle   Iris Round and dilated Round and dilated   Lens Clear Clear   Anterior Vitreous mild syneresis mild  syneresis         Fundus Exam       Right Left   Disc Pink and Sharp, mild PPP Pink and Sharp, mild PPP   C/D Ratio 0.2 0.2   Macula Flat, Good foveal reflex, RPE mottling, focal drusen IT to fovea, No heme or edema Blunted foveal reflex, flame hemes and DBH IT macula -- stably improved, stable improvement in cystic changes IT and central mac--no edema   Vessels attenuated, Tortuous attenuated, Tortuous -- IT BRVO   Periphery Attached Attached           Refraction     Wearing Rx       Sphere Cylinder Axis   Right +2.75 +0.25 117   Left +3.25 +0.25 060           IMAGING AND PROCEDURES  Imaging and Procedures for 06/02/2022  OCT, Retina - OU - Both Eyes       Right Eye Quality was good. Central Foveal Thickness: 261. Progression has been stable. Findings include normal foveal contour, no IRF, no SRF, retinal drusen , vitreomacular adhesion (Rare drusen).   Left Eye Quality was good. Central Foveal Thickness: 257. Progression has been stable. Findings include normal foveal contour, no IRF, no SRF, vitreomacular adhesion (Stable  improvement / resolution of cystic changes IT macula).   Notes *Images captured and stored on drive  Diagnosis / Impression:  OD: NFP, no IRF/SRF OS: BRVO w/ CME -- stable improvement / resolution of cystic changes IT macula  Clinical management:  See below  Abbreviations: NFP - Normal foveal profile. CME - cystoid macular edema. PED - pigment epithelial detachment. IRF - intraretinal fluid. SRF - subretinal fluid. EZ - ellipsoid zone. ERM - epiretinal membrane. ORA - outer retinal atrophy. ORT - outer retinal tubulation. SRHM - subretinal hyper-reflective material. IRHM - intraretinal hyper-reflective material      Intravitreal Injection, Pharmacologic Agent - OS - Left Eye       Time Out 06/02/2022. 2:59 PM. Confirmed correct patient, procedure, site, and patient consented.   Anesthesia Topical anesthesia was used. Anesthetic medications included Lidocaine 2%, Proparacaine 0.5%.   Procedure Preparation included 5% betadine to ocular surface, eyelid speculum. A (32g) needle was used.   Injection: 2 mg aflibercept 2 MG/0.05ML   Route: Intravitreal, Site: Left Eye   NDC: [id number], Lot: [id number], Expiration date: 08/23/2023, Waste: 0 mL   Post-op Post injection exam found visual acuity of at least counting fingers. The patient tolerated the procedure well. There were no complications. The patient received written and verbal post procedure care education. Post injection medications were not given.            ASSESSMENT/PLAN:    ICD-10-CM   1. Branch retinal vein occlusion of left eye with macular edema  H34.8320 OCT, Retina - OU - Both Eyes    Intravitreal Injection, Pharmacologic Agent - OS - Left Eye    aflibercept (EYLEA) SOLN 2 mg    2. Essential hypertension  I10     3. Hypertensive retinopathy of both eyes  H35.033      BRVO w/ CME OS  - s/p IVA OS #1 (06.07.23), #2 (07.06.23), #3 (08.04.23), #4 (09.01.23) -- IVA resistance  - s/p IVE OS #1 (10.06.23), #2  (11.06.23), #3 (12.04.23) - FA (06.02.23) shows focal BRVO inferior macula - originally, pt asymptomatic and didn't realize she had an issue until her routine eye exam - BCVA OS 20/20 -- stable - OCT shows stable  improvement / resolution of cystic changes IT macula at 5 weeks - recommend IVE OS #4 today, 01.08.24 with f/u extended to 6-7 weeks - pt wishes to proceed with IVE injection - RBA of procedure discussed, questions answered - IVA informed consent obtained and signed, OS (06.07.23) - IVE informed consent obtained and signed, OS (10.0.23) - see procedure note  - f/u 6-7 weeks, DFE, OCT, possible injxn  Ophthalmic Meds Ordered this visit:  Meds ordered this encounter  Medications   aflibercept (EYLEA) SOLN 2 mg     Return for 6-7 weeks BRVO OS, DFE, OCT likely injection OS.  There are no Patient Instructions on file for this visit.  Explained the diagnoses, plan, and follow up with the patient and they expressed understanding.  Patient expressed understanding of the importance of proper follow up care.   This document serves as a record of services personally performed by Gardiner Sleeper, MD, PhD. It was created on their behalf by Orvan [place], an ophthalmic technician. The creation of this record is the provider's dictation and/or activities during the visit.    Electronically signed by: Orvan [place], OA, 06/02/22  3:36 PM  This document serves as a record of services personally performed by Gardiner Sleeper, MD, PhD. It was created on their behalf by Orvan [place], an ophthalmic technician. The creation of this record is the provider's dictation and/or activities during the visit.    Electronically signed by: Orvan [place], OA, 06/02/22  3:36 PM  Gardiner Sleeper, M.D., Ph.D. Diseases & Surgery of the Retina and Vitreous [facility]  I have reviewed the above documentation for accuracy and completeness, and I agree with the above. Gardiner Sleeper, M.D., Ph.D. 06/02/22 3:37 PM  Abbreviations: M myopia (nearsighted); A astigmatism; H hyperopia (farsighted); P presbyopia; Mrx spectacle prescription;  CTL contact lenses; OD right eye; OS left eye; OU both eyes  XT exotropia; ET esotropia; PEK punctate epithelial keratitis; PEE punctate epithelial erosions; DES dry eye syndrome; MGD meibomian gland dysfunction; ATs artificial tears; PFAT's preservative free artificial tears; [place] nuclear sclerotic cataract; PSC posterior subcapsular cataract; ERM epi-retinal membrane; PVD posterior vitreous detachment; RD retinal detachment; DM diabetes mellitus; DR diabetic retinopathy; NPDR non-proliferative diabetic retinopathy; PDR proliferative diabetic retinopathy; CSME clinically significant macular edema; DME diabetic macular edema; dbh dot blot hemorrhages; CWS cotton wool spot; POAG primary open angle glaucoma; C/D cup-to-disc ratio; HVF humphrey visual field; GVF goldmann visual field; OCT optical coherence tomography; IOP intraocular pressure; BRVO Branch retinal vein occlusion; CRVO central retinal vein occlusion; CRAO central retinal artery occlusion; BRAO branch retinal artery occlusion; RT retinal tear; SB scleral buckle; PPV pars plana vitrectomy; VH Vitreous hemorrhage; PRP panretinal laser photocoagulation; IVK intravitreal kenalog; VMT vitreomacular traction; MH Macular hole;  NVD neovascularization of the disc; NVE neovascularization elsewhere; AREDS age related eye disease study; ARMD age related macular degeneration; POAG primary open angle glaucoma; EBMD epithelial/anterior basement membrane dystrophy; ACIOL anterior chamber intraocular lens; IOL intraocular lens; PCIOL posterior chamber intraocular lens; Phaco/IOL phacoemulsification with intraocular lens placement; [place] photorefractive keratectomy; LASIK laser assisted in situ keratomileusis; HTN hypertension; DM diabetes mellitus; COPD chronic obstructive pulmonary disease

## 2022-06-02 ENCOUNTER — Ambulatory Visit (INDEPENDENT_AMBULATORY_CARE_PROVIDER_SITE_OTHER): Payer: BC Managed Care – PPO | Admitting: Ophthalmology

## 2022-06-02 ENCOUNTER — Encounter (INDEPENDENT_AMBULATORY_CARE_PROVIDER_SITE_OTHER): Payer: Self-pay | Admitting: Ophthalmology

## 2022-06-02 DIAGNOSIS — H34832 Tributary (branch) retinal vein occlusion, left eye, with macular edema: Secondary | ICD-10-CM | POA: Diagnosis not present

## 2022-06-02 DIAGNOSIS — I10 Essential (primary) hypertension: Secondary | ICD-10-CM

## 2022-06-02 DIAGNOSIS — H35033 Hypertensive retinopathy, bilateral: Secondary | ICD-10-CM | POA: Diagnosis not present

## 2022-06-02 MED ORDER — AFLIBERCEPT 2MG/0.05ML IZ SOLN FOR KALEIDOSCOPE
2.0000 mg | INTRAVITREAL | Status: AC | PRN
  Administered 2022-06-02: 2 mg via INTRAVITREAL

## 2022-07-17 NOTE — Progress Notes (Signed)
[facility] Note  07/21/2022     CHIEF COMPLAINT Patient presents for Retina Follow Up   HISTORY OF PRESENT ILLNESS: Sonya Anderson is a 57 y.o. female who presents to the clinic today for:   HPI     Retina Follow Up   Patient presents with  CRVO/BRVO.  In left eye.  This started 7 weeks ago.  I, the attending physician,  performed the HPI with the patient and updated documentation appropriately.        Comments   Patient here for 7 weeks retina follow up for BRVO OS. Patient states vision doing good. No eye pain.       Last edited by [name], MD on 07/21/2022 11:31 AM.      Referring physician: [name], [place], [place] [id number]  Pt states [place] the same, no changes.   HISTORICAL INFORMATION:   Selected notes from the MEDICAL RECORD NUMBER Referred by Dr. Hassell Done for BRVO w/ CME OS LEE:  Ocular Hx- CME and BRVO OS PMH-    CURRENT MEDICATIONS: No current outpatient medications on file. (Ophthalmic Drugs)   No current facility-administered medications for this visit. (Ophthalmic Drugs)   No current outpatient medications on file. (Other)   No current facility-administered medications for this visit. (Other)   REVIEW OF SYSTEMS: ROS   Positive for: Eyes Negative for: Constitutional, Gastrointestinal, Neurological, Skin, Genitourinary, Musculoskeletal, HENT, Endocrine, Cardiovascular, Respiratory, Psychiatric, Allergic/Imm, Heme/Lymph Last edited by Theodore Demark, COA on 07/21/2022  9:06 AM.      ALLERGIES No Known Allergies  PAST MEDICAL HISTORY History reviewed. No pertinent past medical history. History reviewed. No pertinent surgical history.  FAMILY HISTORY History reviewed. No pertinent family history.  SOCIAL HISTORY Social History   Tobacco Use   Smoking status: Never   Smokeless tobacco: Never  Vaping Use   Vaping Use: Never used  Substance Use Topics   Alcohol use: Never   Drug use:  Never       OPHTHALMIC EXAM:  Base Eye Exam     Visual Acuity (Snellen - Linear)       Right Left   Dist cc 20/20 20/20    Correction: Glasses         Tonometry (Tonopen, 9:04 AM)       Right Left   Pressure 14 12         Pupils       Dark Light Shape React APD   Right 3 2 Round Brisk None   Left 3 2 Round Brisk None         Visual Fields (Counting fingers)       Left Right    Full Full         Extraocular Movement       Right Left    Full, Ortho Full, Ortho         Neuro/Psych     Oriented x3: Yes   Mood/Affect: Normal         Dilation     Both eyes: 1.0% Mydriacyl, 2.5% Phenylephrine @ 9:04 AM           Slit Lamp and Fundus Exam     Slit Lamp Exam       Right Left   Lids/Lashes Dermatochalasis - upper lid Dermatochalasis - upper lid   Conjunctiva/Sclera White and quiet White and quiet   Cornea Trace tear film debris Trace  tear film debris   Anterior Chamber deep, clear, narrow temporal angle deep, clear, narrow temporal angle   Iris Round and dilated Round and dilated   Lens Clear Clear   Anterior Vitreous mild syneresis mild syneresis         Fundus Exam       Right Left   Disc Pink and Sharp, mild PPP Pink and Sharp, mild PPP   C/D Ratio 0.2 0.2   Macula Flat, Good foveal reflex, RPE mottling, focal drusen IT to fovea, No heme or edema Blunted foveal reflex, flame hemes and DBH IT macula -- stably improved, trace cystic changes IT macula   Vessels attenuated, Tortuous attenuated, Tortuous -- IT BRVO   Periphery Attached Attached           Refraction     Wearing Rx       Sphere Cylinder Axis   Right +2.75 +0.25 117   Left +3.25 +0.25 060           IMAGING AND PROCEDURES  Imaging and Procedures for 07/21/2022  OCT, Retina - OU - Both Eyes       Right Eye Quality was good. Central Foveal Thickness: 263. Progression has been stable. Findings include normal foveal contour, no IRF, no SRF, retinal  drusen , vitreomacular adhesion (Rare drusen).   Left Eye Quality was good. Central Foveal Thickness: 256. Progression has been stable. Findings include normal foveal contour, no SRF, intraretinal fluid, vitreomacular adhesion (Persistent cystic changes IT macula).   Notes *Images captured and stored on drive  Diagnosis / Impression:  OD: NFP, no IRF/SRF OS: BRVO w/ CME -- persistent cystic changes IT macula  Clinical management:  See below  Abbreviations: NFP - Normal foveal profile. CME - cystoid macular edema. PED - pigment epithelial detachment. IRF - intraretinal fluid. SRF - subretinal fluid. EZ - ellipsoid zone. ERM - epiretinal membrane. ORA - outer retinal atrophy. ORT - outer retinal tubulation. SRHM - subretinal hyper-reflective material. IRHM - intraretinal hyper-reflective material      Intravitreal Injection, Pharmacologic Agent - OS - Left Eye       Time Out 07/21/2022. 9:28 AM. Confirmed correct patient, procedure, site, and patient consented.   Anesthesia Topical anesthesia was used. Anesthetic medications included Lidocaine 2%, Proparacaine 0.5%.   Procedure Preparation included 5% betadine to ocular surface, eyelid speculum. A (32g) needle was used.   Injection: 2 mg aflibercept 2 MG/0.05ML   Route: Intravitreal, Site: Left Eye   NDC: [id number], Lot: [id number], Expiration date: 07/24/2023, Waste: 0 mL   Post-op Post injection exam found visual acuity of at least counting fingers. The patient tolerated the procedure well. There were no complications. The patient received written and verbal post procedure care education. Post injection medications were not given.            ASSESSMENT/PLAN:    ICD-10-CM   1. Branch retinal vein occlusion of left eye with macular edema  H34.8320 OCT, Retina - OU - Both Eyes    Intravitreal Injection, Pharmacologic Agent - OS - Left Eye    aflibercept (EYLEA) SOLN 2 mg     BRVO w/ CME OS  - s/p IVA OS #1  (06.07.23), #2 (07.06.23), #3 (08.04.23), #4 (09.01.23) -- IVA resistance  - s/p IVE OS #1 (10.06.23), #2 (11.06.23), #3 (12.04.23), #4 (01.08.24) - FA (06.02.23) shows focal BRVO inferior macula - originally, pt asymptomatic and didn't realize she had an issue until her routine eye exam - BCVA OS 20/20 --  stable - OCT shows persistent cystic changes IT macula at 7 weeks - recommend IVE OS #5 today, 02.26.24 with f/u at 7 weeks - pt wishes to proceed with IVE injection - RBA of procedure discussed, questions answered - IVA informed consent obtained and signed, OS (06.07.23) - IVE informed consent obtained and signed, OS (10.0.23) - see procedure note  - f/u 7 weeks, DFE, OCT, possible injxn  Ophthalmic Meds Ordered this visit:  Meds ordered this encounter  Medications   aflibercept (EYLEA) SOLN 2 mg     Return in about 7 weeks (around 09/08/2022) for f/u BRVO OS, DFE, OCT.  There are no Patient Instructions on file for this visit.  Explained the diagnoses, plan, and follow up with the patient and they expressed understanding.  Patient expressed understanding of the importance of proper follow up care.   This document serves as a record of services personally performed by Gardiner Sleeper, MD, PhD. It was created on their behalf by Roselee Nova, COMT. The creation of this record is the provider's dictation and/or activities during the visit.  Electronically signed by: Roselee Nova, COMT 07/21/22 11:35 AM  This document serves as a record of services personally performed by Gardiner Sleeper, MD, PhD. It was created on their behalf by San Jetty. Owens Shark, OA an ophthalmic technician. The creation of this record is the provider's dictation and/or activities during the visit.    Electronically signed by: San Jetty. Owens Shark, [place] 02.26.2024 11:35 AM  Gardiner Sleeper, M.D., Ph.D. Diseases & Surgery of the Retina and Vitreous [facility]  I have reviewed the above  documentation for accuracy and completeness, and I agree with the above. Gardiner Sleeper, M.D., Ph.D. 07/21/22 11:36 AM   Abbreviations: M myopia (nearsighted); A astigmatism; H hyperopia (farsighted); P presbyopia; Mrx spectacle prescription;  CTL contact lenses; OD right eye; OS left eye; OU both eyes  XT exotropia; ET esotropia; PEK punctate epithelial keratitis; PEE punctate epithelial erosions; DES dry eye syndrome; MGD meibomian gland dysfunction; ATs artificial tears; PFAT's preservative free artificial tears; [place] nuclear sclerotic cataract; PSC posterior subcapsular cataract; ERM epi-retinal membrane; PVD posterior vitreous detachment; RD retinal detachment; DM diabetes mellitus; DR diabetic retinopathy; NPDR non-proliferative diabetic retinopathy; PDR proliferative diabetic retinopathy; CSME clinically significant macular edema; DME diabetic macular edema; dbh dot blot hemorrhages; CWS cotton wool spot; POAG primary open angle glaucoma; C/D cup-to-disc ratio; HVF humphrey visual field; GVF goldmann visual field; OCT optical coherence tomography; IOP intraocular pressure; BRVO Branch retinal vein occlusion; CRVO central retinal vein occlusion; CRAO central retinal artery occlusion; BRAO branch retinal artery occlusion; RT retinal tear; SB scleral buckle; PPV pars plana vitrectomy; VH Vitreous hemorrhage; PRP panretinal laser photocoagulation; IVK intravitreal kenalog; VMT vitreomacular traction; MH Macular hole;  NVD neovascularization of the disc; NVE neovascularization elsewhere; AREDS age related eye disease study; ARMD age related macular degeneration; POAG primary open angle glaucoma; EBMD epithelial/anterior basement membrane dystrophy; ACIOL anterior chamber intraocular lens; IOL intraocular lens; PCIOL posterior chamber intraocular lens; Phaco/IOL phacoemulsification with intraocular lens placement; [place] photorefractive keratectomy; LASIK laser assisted in situ keratomileusis; HTN hypertension; DM  diabetes mellitus; COPD chronic obstructive pulmonary disease

## 2022-07-21 ENCOUNTER — Encounter (INDEPENDENT_AMBULATORY_CARE_PROVIDER_SITE_OTHER): Payer: Self-pay | Admitting: Ophthalmology

## 2022-07-21 ENCOUNTER — Ambulatory Visit (INDEPENDENT_AMBULATORY_CARE_PROVIDER_SITE_OTHER): Payer: BC Managed Care – PPO | Admitting: Ophthalmology

## 2022-07-21 DIAGNOSIS — H34832 Tributary (branch) retinal vein occlusion, left eye, with macular edema: Secondary | ICD-10-CM | POA: Diagnosis not present

## 2022-07-21 MED ORDER — AFLIBERCEPT 2MG/0.05ML IZ SOLN FOR KALEIDOSCOPE
2.0000 mg | INTRAVITREAL | Status: AC | PRN
  Administered 2022-07-21: 2 mg via INTRAVITREAL

## 2022-09-02 NOTE — Progress Notes (Signed)
[facility] - Clinic Note  09/08/2022     CHIEF COMPLAINT Patient presents for Retina Follow Up   HISTORY OF PRESENT ILLNESS: Sonya Anderson is a 57 y.o. female who presents to the clinic today for:   HPI     Retina Follow Up   Patient presents with  CRVO/BRVO.  In left eye.  This started 7 weeks ago.  Duration of 7 weeks.  Since onset it is stable.  I, the attending physician,  performed the HPI with the patient and updated documentation appropriately.        Comments   7 week retina follow up BRVO OS and I'VE OS pt reporting no vision changes noticed she denies any flashes or floaters       Last edited by Rennis ChrisZamora, Mirza Fessel, MD on 09/08/2022  1:19 PM.     Patient states that she had her gall bladder out a week ago. She has been very stressed out.    Referring physician: Smitty Cordsustin Martin, OD [address]  [place], [place] [id number]   HISTORICAL INFORMATION:   Selected notes from the MEDICAL RECORD NUMBER Referred by Dr. Daphine DeutscherMartin for BRVO w/ CME OS LEE:  Ocular Hx- CME and BRVO OS PMH-    CURRENT MEDICATIONS: No current outpatient medications on file. (Ophthalmic Drugs)   No current facility-administered medications for this visit. (Ophthalmic Drugs)   Current Outpatient Medications (Other)  Medication Sig   omeprazole (PRILOSEC) 40 MG capsule Take by mouth.   No current facility-administered medications for this visit. (Other)   REVIEW OF SYSTEMS: ROS   Positive for: Eyes Negative for: Constitutional, Gastrointestinal, Neurological, Skin, Genitourinary, Musculoskeletal, HENT, Endocrine, Cardiovascular, Respiratory, Psychiatric, Allergic/Imm, Heme/Lymph Last edited by Etheleen MayhewMendenhall, Jennifer W, COT on 09/08/2022  9:29 AM.       ALLERGIES No Known Allergies  PAST MEDICAL HISTORY History reviewed. No pertinent past medical history. Past Surgical History:  Procedure Laterality Date   CHOLECYSTECTOMY      FAMILY HISTORY History reviewed. No pertinent  family history.  SOCIAL HISTORY Social History   Tobacco Use   Smoking status: Never   Smokeless tobacco: Never  Vaping Use   Vaping Use: Never used  Substance Use Topics   Alcohol use: Never   Drug use: Never       OPHTHALMIC EXAM:  Base Eye Exam     Visual Acuity (Snellen - Linear)       Right Left   Dist cc 20/25 20/20 -1   Dist ph cc NI NI    Correction: Glasses         Tonometry (Tonopen, 9:32 AM)       Right Left   Pressure 12 12         Pupils       Pupils Dark Light Shape React APD   Right PERRL 3 2 Round Brisk None   Left PERRL 3 2 Round Brisk None         Visual Fields       Left Right    Full Full         Extraocular Movement       Right Left    Full, Ortho Full, Ortho         Neuro/Psych     Oriented x3: Yes   Mood/Affect: Normal         Dilation     Both eyes: 2.5% Phenylephrine @ 9:33 AM  Slit Lamp and Fundus Exam     Slit Lamp Exam       Right Left   Lids/Lashes Dermatochalasis - upper lid Dermatochalasis - upper lid   Conjunctiva/Sclera White and quiet White and quiet   Cornea Trace tear film debris Trace tear film debris   Anterior Chamber deep, clear, narrow temporal angle deep, clear, narrow temporal angle   Iris Round and dilated Round and dilated   Lens Clear Clear   Anterior Vitreous mild syneresis mild syneresis         Fundus Exam       Right Left   Disc Pink and Sharp, mild PPP Pink and Sharp, mild PPP   C/D Ratio 0.2 0.2   Macula Flat, Good foveal reflex, RPE mottling, focal drusen IT to fovea, No heme or edema Blunted foveal reflex, flame hemes and DBH IT macula -- stably improved, trace cystic changes IT macula-- slightly increased   Vessels attenuated, Tortuous attenuated, Tortuous -- IT BRVO   Periphery Attached Attached           Refraction     Wearing Rx       Sphere Cylinder Axis   Right +2.75 +0.25 117   Left +3.25 +0.25 060           IMAGING AND  PROCEDURES  Imaging and Procedures for 09/08/2022  OCT, Retina - OU - Both Eyes       Right Eye Quality was good. Central Foveal Thickness: 264. Progression has been stable. Findings include normal foveal contour, no IRF, no SRF, retinal drusen , vitreomacular adhesion (Rare drusen).   Left Eye Quality was good. Central Foveal Thickness: 259. Progression has worsened. Findings include normal foveal contour, no SRF, intraretinal fluid, vitreomacular adhesion (Interval increase in cystic changes IT macula).   Notes *Images captured and stored on drive  Diagnosis / Impression:  OD: NFP, no IRF/SRF OS: BRVO w/ CME -- interval increase in cystic changes IT macula  Clinical management:  See below  Abbreviations: NFP - Normal foveal profile. CME - cystoid macular edema. PED - pigment epithelial detachment. IRF - intraretinal fluid. SRF - subretinal fluid. EZ - ellipsoid zone. ERM - epiretinal membrane. ORA - outer retinal atrophy. ORT - outer retinal tubulation. SRHM - subretinal hyper-reflective material. IRHM - intraretinal hyper-reflective material      Intravitreal Injection, Pharmacologic Agent - OS - Left Eye       Time Out 09/08/2022. 9:57 AM. Confirmed correct patient, procedure, site, and patient consented.   Anesthesia Topical anesthesia was used. Anesthetic medications included Lidocaine 2%, Proparacaine 0.5%.   Procedure Preparation included 5% betadine to ocular surface, eyelid speculum. A (32g) needle was used.   Injection: 2 mg aflibercept 2 MG/0.05ML   Route: Intravitreal, Site: Left Eye   NDC: [id number], Lot: [id number], Expiration date: 09/23/2023, Waste: 0 mL   Post-op Post injection exam found visual acuity of at least counting fingers. The patient tolerated the procedure well. There were no complications. The patient received written and verbal post procedure care education. Post injection medications were not given.             ASSESSMENT/PLAN:     ICD-10-CM   1. Branch retinal vein occlusion of left eye with macular edema  H34.8320 OCT, Retina - OU - Both Eyes    Intravitreal Injection, Pharmacologic Agent - OS - Left Eye    aflibercept (EYLEA) SOLN 2 mg     BRVO w/ CME OS - s/p IVA  OS #1 (06.07.23), #2 (07.06.23), #3 (08.04.23), #4 (09.01.23) -- IVA resistance - s/p IVE OS #1 (10.06.23), #2 (11.06.23), #3 (12.04.23), #4 (01.08.24), #5 (02.26.24) - FA (06.02.23) shows focal BRVO inferior macula - originally, pt asymptomatic and didn't realize she had an issue until her routine eye exam - BCVA OS 20/20 -- stable - OCT shows interval increase in cystic changes IT macula at 7 weeks - recommend IVE OS #6 today, 04.15.24 with f/u at 7 weeks - pt wishes to proceed with IVE injection - RBA of procedure discussed, questions answered - IVA informed consent obtained and signed, OS (06.07.23) - IVE informed consent obtained and signed, OS (10.0.23) - see procedure note  - f/u 7 weeks, DFE, OCT, possible injxn  Ophthalmic Meds Ordered this visit:  Meds ordered this encounter  Medications   aflibercept (EYLEA) SOLN 2 mg     Return in about 7 weeks (around 10/27/2022) for f/u BRVO OS , DFE, OCT, Possible, IVE, OS.  There are no Patient Instructions on file for this visit.  Explained the diagnoses, plan, and follow up with the patient and they expressed understanding.  Patient expressed understanding of the importance of proper follow up care.   This document serves as a record of services personally performed by Karie Chimera, MD, PhD. It was created on their behalf by Annalee Genta, COMT. The creation of this record is the provider's dictation and/or activities during the visit.  Electronically signed by: Annalee Genta, COMT 09/08/22 1:20 PM  This document serves as a record of services personally performed by Karie Chimera, MD, PhD. It was created on their behalf by Gerilyn Nestle, COT an ophthalmic technician. The creation of  this record is the provider's dictation and/or activities during the visit.    Electronically signed by:  Gerilyn Nestle, COT  04.15.24 1:20 PM  Karie Chimera, M.D., Ph.D. Diseases & Surgery of the Retina and Vitreous [facility]  I have reviewed the above documentation for accuracy and completeness, and I agree with the above. Karie Chimera, M.D., Ph.D. 09/08/22 1:20 PM  Abbreviations: M myopia (nearsighted); A astigmatism; H hyperopia (farsighted); P presbyopia; Mrx spectacle prescription;  CTL contact lenses; OD right eye; OS left eye; OU both eyes  XT exotropia; ET esotropia; PEK punctate epithelial keratitis; PEE punctate epithelial erosions; DES dry eye syndrome; MGD meibomian gland dysfunction; ATs artificial tears; PFAT's preservative free artificial tears; NSC nuclear sclerotic cataract; PSC posterior subcapsular cataract; ERM epi-retinal membrane; PVD posterior vitreous detachment; RD retinal detachment; DM diabetes mellitus; DR diabetic retinopathy; NPDR non-proliferative diabetic retinopathy; PDR proliferative diabetic retinopathy; CSME clinically significant macular edema; DME diabetic macular edema; dbh dot blot hemorrhages; CWS cotton wool spot; POAG primary open angle glaucoma; C/D cup-to-disc ratio; HVF humphrey visual field; GVF goldmann visual field; OCT optical coherence tomography; IOP intraocular pressure; BRVO Branch retinal vein occlusion; CRVO central retinal vein occlusion; CRAO central retinal artery occlusion; BRAO branch retinal artery occlusion; RT retinal tear; SB scleral buckle; PPV pars plana vitrectomy; VH Vitreous hemorrhage; PRP panretinal laser photocoagulation; IVK intravitreal kenalog; VMT vitreomacular traction; MH Macular hole;  NVD neovascularization of the disc; NVE neovascularization elsewhere; AREDS age related eye disease study; ARMD age related macular degeneration; POAG primary open angle glaucoma; EBMD epithelial/anterior  basement membrane dystrophy; ACIOL anterior chamber intraocular lens; IOL intraocular lens; PCIOL posterior chamber intraocular lens; Phaco/IOL phacoemulsification with intraocular lens placement; PRK photorefractive keratectomy; LASIK laser assisted in situ keratomileusis; HTN hypertension; DM diabetes  mellitus; COPD chronic obstructive pulmonary disease

## 2022-09-08 ENCOUNTER — Encounter (INDEPENDENT_AMBULATORY_CARE_PROVIDER_SITE_OTHER): Payer: Self-pay | Admitting: Ophthalmology

## 2022-09-08 ENCOUNTER — Ambulatory Visit (INDEPENDENT_AMBULATORY_CARE_PROVIDER_SITE_OTHER): Payer: BC Managed Care – PPO | Admitting: Ophthalmology

## 2022-09-08 DIAGNOSIS — H34832 Tributary (branch) retinal vein occlusion, left eye, with macular edema: Secondary | ICD-10-CM

## 2022-09-08 MED ORDER — AFLIBERCEPT 2MG/0.05ML IZ SOLN FOR KALEIDOSCOPE
2.0000 mg | INTRAVITREAL | Status: AC | PRN
Start: 2022-09-08 — End: 2022-09-08
  Administered 2022-09-08: 2 mg via INTRAVITREAL

## 2022-10-23 NOTE — Progress Notes (Signed)
[facility] - Clinic Note  10/27/2022     CHIEF COMPLAINT Patient presents for Retina Follow Up   HISTORY OF PRESENT ILLNESS: Sonya Anderson is a 57 y.o. female who presents to the clinic today for:   HPI     Retina Follow Up   Patient presents with  CRVO/BRVO.  In left eye.  Severity is moderate.  Duration of 7 weeks.  Since onset it is stable.  I, the attending physician,  performed the HPI with the patient and updated documentation appropriately.        Comments   Pt here for 7 wk ret f/u for BRVO OS. Pt states VA is stable, no changes she's noticed.       Last edited by Rennis Chris, MD on 10/27/2022  9:05 AM.     Referring physician: Smitty Cords, OD [address] [place], [place] [address]   HISTORICAL INFORMATION:   Selected notes from the MEDICAL RECORD NUMBER Referred by Dr. Daphine Deutscher for BRVO w/ CME OS LEE:  Ocular Hx- CME and BRVO OS PMH-    CURRENT MEDICATIONS: No current outpatient medications on file. (Ophthalmic Drugs)   No current facility-administered medications for this visit. (Ophthalmic Drugs)   Current Outpatient Medications (Other)  Medication Sig   omeprazole (PRILOSEC) 40 MG capsule Take by mouth.   No current facility-administered medications for this visit. (Other)   REVIEW OF SYSTEMS: ROS   Positive for: Eyes Negative for: Constitutional, Gastrointestinal, Neurological, Skin, Genitourinary, Musculoskeletal, HENT, Endocrine, Cardiovascular, Respiratory, Psychiatric, Allergic/Imm, Heme/Lymph Last edited by Thompson Grayer, COT on 10/27/2022  8:22 AM.     ALLERGIES No Known Allergies  PAST MEDICAL HISTORY History reviewed. No pertinent past medical history. Past Surgical History:  Procedure Laterality Date   CHOLECYSTECTOMY      FAMILY HISTORY History reviewed. No pertinent family history.  SOCIAL HISTORY Social History   Tobacco Use   Smoking status: Never   Smokeless tobacco: Never  Vaping Use   Vaping  Use: Never used  Substance Use Topics   Alcohol use: Never   Drug use: Never       OPHTHALMIC EXAM:  Base Eye Exam     Visual Acuity (Snellen - Linear)       Right Left   Dist cc 20/20 20/20    Correction: Glasses         Tonometry (Tonopen, 8:25 AM)       Right Left   Pressure 15 13         Pupils       Pupils Dark Light Shape React APD   Right PERRL 3 2 Round Brisk None   Left PERRL 3 2 Round Brisk None         Visual Fields (Counting fingers)       Left Right    Full Full         Extraocular Movement       Right Left    Full, Ortho Full, Ortho         Neuro/Psych     Oriented x3: Yes   Mood/Affect: Normal         Dilation     Both eyes: 1.0% Mydriacyl, 2.5% Phenylephrine @ 8:26 AM           Slit Lamp and Fundus Exam     Slit Lamp Exam       Right Left   Lids/Lashes Dermatochalasis - upper lid  Dermatochalasis - upper lid   Conjunctiva/Sclera White and quiet White and quiet   Cornea Trace tear film debris Trace tear film debris   Anterior Chamber deep, clear, narrow temporal angle deep, clear, narrow temporal angle   Iris Round and dilated Round and dilated   Lens Clear Clear   Anterior Vitreous mild syneresis mild syneresis         Fundus Exam       Right Left   Disc Pink and Sharp, mild PPP Pink and Sharp, mild PPP   C/D Ratio 0.2 0.2   Macula Flat, Good foveal reflex, RPE mottling, focal drusen IT to fovea, No heme or edema Blunted foveal reflex, flame hemes and DBH IT macula -- stably improved, trace cystic changes IT macula -- improved   Vessels attenuated, mild tortuosity attenuated, Tortuous -- IT BRVO   Periphery Attached Attached           Refraction     Wearing Rx       Sphere Cylinder Axis   Right +2.75 +0.25 117   Left +3.25 +0.25 060           IMAGING AND PROCEDURES  Imaging and Procedures for 10/27/2022  OCT, Retina - OU - Both Eyes       Right Eye Quality was good. Central Foveal  Thickness: 264. Progression has been stable. Findings include normal foveal contour, no IRF, no SRF, retinal drusen , vitreomacular adhesion (Rare drusen).   Left Eye Quality was good. Central Foveal Thickness: 256. Progression has improved. Findings include normal foveal contour, no SRF, intraretinal fluid, vitreomacular adhesion (Interval improvement in cystic changes IT macula).   Notes *Images captured and stored on drive  Diagnosis / Impression:  OD: NFP, no IRF/SRF OS: BRVO w/ CME -- interval improvement in cystic changes IT macula  Clinical management:  See below  Abbreviations: NFP - Normal foveal profile. CME - cystoid macular edema. PED - pigment epithelial detachment. IRF - intraretinal fluid. SRF - subretinal fluid. EZ - ellipsoid zone. ERM - epiretinal membrane. ORA - outer retinal atrophy. ORT - outer retinal tubulation. SRHM - subretinal hyper-reflective material. IRHM - intraretinal hyper-reflective material      Intravitreal Injection, Pharmacologic Agent - OS - Left Eye       Time Out 10/27/2022. 8:52 AM. Confirmed correct patient, procedure, site, and patient consented.   Anesthesia Topical anesthesia was used. Anesthetic medications included Lidocaine 2%, Proparacaine 0.5%.   Procedure Preparation included 5% betadine to ocular surface, eyelid speculum. A (32g) needle was used.   Injection: 2 mg aflibercept 2 MG/0.05ML   Route: Intravitreal, Site: Left Eye   NDC: [id number], Lot: [id number], Expiration date: 09/23/2023, Waste: 0 mL   Post-op Post injection exam found visual acuity of at least counting fingers. The patient tolerated the procedure well. There were no complications. The patient received written and verbal post procedure care education. Post injection medications were not given.            ASSESSMENT/PLAN:    ICD-10-CM   1. Branch retinal vein occlusion of left eye with macular edema  H34.8320 OCT, Retina - OU - Both Eyes     Intravitreal Injection, Pharmacologic Agent - OS - Left Eye    aflibercept (EYLEA) SOLN 2 mg     BRVO w/ CME OS - s/p IVA OS #1 (06.07.23), #2 (07.06.23), #3 (08.04.23), #4 (09.01.23) -- IVA resistance - s/p IVE OS #1 (10.06.23), #2 (11.06.23), #3 (12.04.23), #4 (01.08.24), #5 (02.26.24), #6 (  04.15.24) - FA (06.02.23) shows focal BRVO inferior macula - originally, pt asymptomatic and didn't realize she had an issue until her routine eye exam - BCVA OS 20/20 -- stable - OCT shows interval improvement in cystic changes IT macula at 7 weeks - recommend IVE OS #7 today, 06.03.24 with f/u at 7 weeks - pt wishes to proceed with IVE injection - RBA of procedure discussed, questions answered - IVA informed consent obtained and signed, OS (06.07.23) - IVE informed consent obtained and signed, OS (10.0.23) - see procedure note  - f/u 7 weeks, DFE, OCT, possible injxn  Ophthalmic Meds Ordered this visit:  Meds ordered this encounter  Medications   aflibercept (EYLEA) SOLN 2 mg     Return in about 7 weeks (around 12/15/2022) for f/u BRVO OS, DFE, OCT.  There are no Patient Instructions on file for this visit.  Explained the diagnoses, plan, and follow up with the patient and they expressed understanding.  Patient expressed understanding of the importance of proper follow up care.   This document serves as a record of services personally performed by Karie Chimera, MD, PhD. It was created on their behalf by Annalee Genta, COMT. The creation of this record is the provider's dictation and/or activities during the visit.  Electronically signed by: Annalee Genta, COMT 10/27/22 12:49 PM  This document serves as a record of services personally performed by Karie Chimera, MD, PhD. It was created on their behalf by Glee Arvin. Manson Passey, OA an ophthalmic technician. The creation of this record is the provider's dictation and/or activities during the visit.    Electronically signed by: Glee Arvin. Kristopher Oppenheim  06.03.2024 12:49 PM  Karie Chimera, M.D., Ph.D. Diseases & Surgery of the Retina and Vitreous [facility]  I have reviewed the above documentation for accuracy and completeness, and I agree with the above. Karie Chimera, M.D., Ph.D. 10/27/22 12:50 PM   Abbreviations: M myopia (nearsighted); A astigmatism; H hyperopia (farsighted); P presbyopia; Mrx spectacle prescription;  CTL contact lenses; OD right eye; OS left eye; OU both eyes  XT exotropia; ET esotropia; PEK punctate epithelial keratitis; PEE punctate epithelial erosions; DES dry eye syndrome; MGD meibomian gland dysfunction; ATs artificial tears; PFAT's preservative free artificial tears; NSC nuclear sclerotic cataract; PSC posterior subcapsular cataract; ERM epi-retinal membrane; PVD posterior vitreous detachment; RD retinal detachment; DM diabetes mellitus; DR diabetic retinopathy; NPDR non-proliferative diabetic retinopathy; PDR proliferative diabetic retinopathy; CSME clinically significant macular edema; DME diabetic macular edema; dbh dot blot hemorrhages; CWS cotton wool spot; POAG primary open angle glaucoma; C/D cup-to-disc ratio; HVF humphrey visual field; GVF goldmann visual field; OCT optical coherence tomography; IOP intraocular pressure; BRVO Branch retinal vein occlusion; CRVO central retinal vein occlusion; CRAO central retinal artery occlusion; BRAO branch retinal artery occlusion; RT retinal tear; SB scleral buckle; PPV pars plana vitrectomy; VH Vitreous hemorrhage; PRP panretinal laser photocoagulation; IVK intravitreal kenalog; VMT vitreomacular traction; MH Macular hole;  NVD neovascularization of the disc; NVE neovascularization elsewhere; AREDS age related eye disease study; ARMD age related macular degeneration; POAG primary open angle glaucoma; EBMD epithelial/anterior basement membrane dystrophy; ACIOL anterior chamber intraocular lens; IOL intraocular lens; PCIOL posterior chamber intraocular  lens; Phaco/IOL phacoemulsification with intraocular lens placement; PRK photorefractive keratectomy; LASIK laser assisted in situ keratomileusis; HTN hypertension; DM diabetes mellitus; COPD chronic obstructive pulmonary disease

## 2022-10-27 ENCOUNTER — Ambulatory Visit (INDEPENDENT_AMBULATORY_CARE_PROVIDER_SITE_OTHER): Payer: BC Managed Care – PPO | Admitting: Ophthalmology

## 2022-10-27 ENCOUNTER — Encounter (INDEPENDENT_AMBULATORY_CARE_PROVIDER_SITE_OTHER): Payer: Self-pay | Admitting: Ophthalmology

## 2022-10-27 DIAGNOSIS — H34832 Tributary (branch) retinal vein occlusion, left eye, with macular edema: Secondary | ICD-10-CM

## 2022-10-27 MED ORDER — AFLIBERCEPT 2MG/0.05ML IZ SOLN FOR KALEIDOSCOPE
2.0000 mg | INTRAVITREAL | Status: AC | PRN
Start: 2022-10-27 — End: 2022-10-27
  Administered 2022-10-27: 2 mg via INTRAVITREAL

## 2022-12-15 ENCOUNTER — Encounter (INDEPENDENT_AMBULATORY_CARE_PROVIDER_SITE_OTHER): Payer: BC Managed Care – PPO | Admitting: Ophthalmology

## 2022-12-17 NOTE — Progress Notes (Signed)
[facility] - Clinic Note  12/19/2022     CHIEF COMPLAINT Patient presents for Retina Follow Up   HISTORY OF PRESENT ILLNESS: Sonya Anderson is a 57 y.o. female who presents to the clinic today for:   HPI     Retina Follow Up   Patient presents with  CRVO/BRVO.  In left eye.  This started 7 weeks ago.  Duration of 7 weeks.  Since onset it is stable.  I, the attending physician,  performed the HPI with the patient and updated documentation appropriately.        Comments   7 week retina follow up BRVO OS and I'VE OS pt saw Dr Daphine Deutscher for her regular check 2 weeks ago and got a good check up pt denies any vision changes denies any flashes or floaters       Last edited by Rennis Chris, MD on 12/19/2022  3:41 PM.     Patient feels the vision is stable.   Referring physician: Smitty Cords, OD [address] [place], [place] [address]   HISTORICAL INFORMATION:   Selected notes from the MEDICAL RECORD NUMBER Referred by Dr. Daphine Deutscher for BRVO w/ CME OS LEE:  Ocular Hx- CME and BRVO OS PMH-    CURRENT MEDICATIONS: No current outpatient medications on file. (Ophthalmic Drugs)   No current facility-administered medications for this visit. (Ophthalmic Drugs)   Current Outpatient Medications (Other)  Medication Sig   omeprazole (PRILOSEC) 40 MG capsule Take by mouth.   No current facility-administered medications for this visit. (Other)   REVIEW OF SYSTEMS: ROS   Positive for: Eyes Negative for: Constitutional, Gastrointestinal, Neurological, Skin, Genitourinary, Musculoskeletal, HENT, Endocrine, Cardiovascular, Respiratory, Psychiatric, Allergic/Imm, Heme/Lymph Last edited by Etheleen Mayhew, COT on 12/19/2022  1:53 PM.      ALLERGIES No Known Allergies  PAST MEDICAL HISTORY History reviewed. No pertinent past medical history. Past Surgical History:  Procedure Laterality Date   CHOLECYSTECTOMY      FAMILY HISTORY History reviewed. No  pertinent family history.  SOCIAL HISTORY Social History   Tobacco Use   Smoking status: Never   Smokeless tobacco: Never  Vaping Use   Vaping status: Never Used  Substance Use Topics   Alcohol use: Never   Drug use: Never       OPHTHALMIC EXAM:  Base Eye Exam     Visual Acuity (Snellen - Linear)       Right Left   Dist cc 20/20 20/20         Tonometry (Tonopen, 1:55 PM)       Right Left   Pressure 15 16         Pupils       Pupils Dark Light Shape React APD   Right PERRL 3 2 Round Brisk None   Left PERRL 3 2 Round Brisk None         Visual Fields       Left Right    Full Full         Extraocular Movement       Right Left    Full, Ortho Full, Ortho         Neuro/Psych     Oriented x3: Yes   Mood/Affect: Normal         Dilation     Both eyes: 2.5% Phenylephrine @ 1:56 PM           Slit Lamp and Fundus Exam  Slit Lamp Exam       Right Left   Lids/Lashes Dermatochalasis - upper lid Dermatochalasis - upper lid   Conjunctiva/Sclera White and quiet White and quiet   Cornea Trace tear film debris Trace tear film debris   Anterior Chamber deep, clear, narrow temporal angle deep, clear, narrow temporal angle   Iris Round and dilated Round and dilated   Lens Clear Clear   Anterior Vitreous mild syneresis mild syneresis         Fundus Exam       Right Left   Disc Pink and Sharp, mild PPP Pink and Sharp, mild PPP   C/D Ratio 0.2 0.2   Macula Flat, Good foveal reflex, RPE mottling, focal drusen IT to fovea, No heme or edema Blunted foveal reflex, flame hemes and DBH IT macula -- stably improved, trace cystic changes IT macula -- slightly increased   Vessels attenuated, mild tortuosity attenuated, Tortuous -- IT BRVO   Periphery Attached Attached           Refraction     Wearing Rx       Sphere Cylinder Axis   Right +2.75 +0.25 117   Left +3.25 +0.25 060           IMAGING AND PROCEDURES  Imaging and  Procedures for 12/19/2022  OCT, Retina - OU - Both Eyes       Right Eye Quality was good. Central Foveal Thickness: 262. Progression has been stable. Findings include normal foveal contour, no IRF, no SRF, retinal drusen , vitreomacular adhesion (Rare drusen).   Left Eye Quality was good. Central Foveal Thickness: 268. Progression has worsened. Findings include normal foveal contour, no SRF, intraretinal fluid, vitreomacular adhesion (Persistent cystic changes IT macula- slightly increased).   Notes *Images captured and stored on drive  Diagnosis / Impression:  OD: NFP, no IRF/SRF OS: BRVO w/ CME --Persistent cystic changes IT macula- slightly increased  Clinical management:  See below  Abbreviations: NFP - Normal foveal profile. CME - cystoid macular edema. PED - pigment epithelial detachment. IRF - intraretinal fluid. SRF - subretinal fluid. EZ - ellipsoid zone. ERM - epiretinal membrane. ORA - outer retinal atrophy. ORT - outer retinal tubulation. SRHM - subretinal hyper-reflective material. IRHM - intraretinal hyper-reflective material      Intravitreal Injection, Pharmacologic Agent - OS - Left Eye       Time Out 12/19/2022. 2:29 PM. Confirmed correct patient, procedure, site, and patient consented.   Anesthesia Topical anesthesia was used. Anesthetic medications included Lidocaine 2%, Proparacaine 0.5%.   Procedure Preparation included 5% betadine to ocular surface, eyelid speculum. A (32g) needle was used.   Injection: 2 mg aflibercept 2 MG/0.05ML   Route: Intravitreal, Site: Left Eye   NDC: L6038910, Lot: [id number], Expiration date: 01/13/2024, Waste: 0 mL   Post-op Post injection exam found visual acuity of at least counting fingers. The patient tolerated the procedure well. There were no complications. The patient received written and verbal post procedure care education. Post injection medications were not given.             ASSESSMENT/PLAN:     ICD-10-CM   1. Branch retinal vein occlusion of left eye with macular edema  H34.8320 OCT, Retina - OU - Both Eyes    Intravitreal Injection, Pharmacologic Agent - OS - Left Eye    aflibercept (EYLEA) SOLN 2 mg    2. Essential hypertension  I10     3. Hypertensive retinopathy of both eyes  H35.033       BRVO w/ CME OS - s/p IVA OS #1 (06.07.23), #2 (07.06.23), #3 (08.04.23), #4 (09.01.23) -- IVA resistance =========================================================== - s/p IVE OS #1 (10.06.23), #2 (11.06.23), #3 (12.04.23), #4 (01.08.24), #5 (02.26.24), #6 (04.15.24), #7 (06.03.24) **history of increased IRF at 7 wks on 07.26.24 visit** - FA (06.02.23) shows focal BRVO inferior macula - originally, pt asymptomatic and didn't realize she had an issue until her routine eye exam - BCVA OS 20/20 -- stable - OCT shows Persistent cystic changes IT macula- slightly increased at 7 weeks - recommend IVE OS #8 today, 07.26.24 with f/u in 6-7 weeks - pt wishes to proceed with IVE injection - RBA of procedure discussed, questions answered - IVE informed consent obtained and signed, OS (10.0.23) - see procedure note  - f/u 6-7 weeks, DFE, OCT, possible injxn  2,3. Hypertensive retinopathy OU - discussed importance of tight BP control - monitor   Ophthalmic Meds Ordered this visit:  Meds ordered this encounter  Medications   aflibercept (EYLEA) SOLN 2 mg     Return in about 7 weeks (around 02/06/2023) for f/u BRVO OS, DFE, OCT, Possible, IVE, OS.  There are no Patient Instructions on file for this visit.  Explained the diagnoses, plan, and follow up with the patient and they expressed understanding.  Patient expressed understanding of the importance of proper follow up care.   This document serves as a record of services personally performed by Karie Chimera, MD, PhD. It was created on their behalf by Glee Arvin. Manson Passey, OA an ophthalmic technician. The creation of this record is the  provider's dictation and/or activities during the visit.    Electronically signed by: Glee Arvin. Manson Passey, OA 12/19/22 3:42 PM  This document serves as a record of services personally performed by Karie Chimera, MD, PhD. It was created on their behalf by Gerilyn Nestle, COT an ophthalmic technician. The creation of this record is the provider's dictation and/or activities during the visit.    Electronically signed by:  Charlette Caffey, COT  12/19/22 3:42 PM  Karie Chimera, M.D., Ph.D. Diseases & Surgery of the Retina and Vitreous [facility]  I have reviewed the above documentation for accuracy and completeness, and I agree with the above. Karie Chimera, M.D., Ph.D. 12/19/22 3:43 PM  Abbreviations: M myopia (nearsighted); A astigmatism; H hyperopia (farsighted); P presbyopia; Mrx spectacle prescription;  CTL contact lenses; OD right eye; OS left eye; OU both eyes  XT exotropia; ET esotropia; PEK punctate epithelial keratitis; PEE punctate epithelial erosions; DES dry eye syndrome; MGD meibomian gland dysfunction; ATs artificial tears; PFAT's preservative free artificial tears; NSC nuclear sclerotic cataract; PSC posterior subcapsular cataract; ERM epi-retinal membrane; PVD posterior vitreous detachment; RD retinal detachment; DM diabetes mellitus; DR diabetic retinopathy; NPDR non-proliferative diabetic retinopathy; PDR proliferative diabetic retinopathy; CSME clinically significant macular edema; DME diabetic macular edema; dbh dot blot hemorrhages; CWS cotton wool spot; POAG primary open angle glaucoma; C/D cup-to-disc ratio; HVF humphrey visual field; GVF goldmann visual field; OCT optical coherence tomography; IOP intraocular pressure; BRVO Branch retinal vein occlusion; CRVO central retinal vein occlusion; CRAO central retinal artery occlusion; BRAO branch retinal artery occlusion; RT retinal tear; SB scleral buckle; PPV pars plana vitrectomy; VH Vitreous  hemorrhage; PRP panretinal laser photocoagulation; IVK intravitreal kenalog; VMT vitreomacular traction; MH Macular hole;  NVD neovascularization of the disc; NVE neovascularization elsewhere; AREDS age related eye disease study; ARMD age related macular degeneration;  POAG primary open angle glaucoma; EBMD epithelial/anterior basement membrane dystrophy; ACIOL anterior chamber intraocular lens; IOL intraocular lens; PCIOL posterior chamber intraocular lens; Phaco/IOL phacoemulsification with intraocular lens placement; PRK photorefractive keratectomy; LASIK laser assisted in situ keratomileusis; HTN hypertension; DM diabetes mellitus; COPD chronic obstructive pulmonary disease

## 2022-12-19 ENCOUNTER — Ambulatory Visit (INDEPENDENT_AMBULATORY_CARE_PROVIDER_SITE_OTHER): Payer: BC Managed Care – PPO | Admitting: Ophthalmology

## 2022-12-19 ENCOUNTER — Encounter (INDEPENDENT_AMBULATORY_CARE_PROVIDER_SITE_OTHER): Payer: Self-pay | Admitting: Ophthalmology

## 2022-12-19 DIAGNOSIS — H34832 Tributary (branch) retinal vein occlusion, left eye, with macular edema: Secondary | ICD-10-CM

## 2022-12-19 DIAGNOSIS — I10 Essential (primary) hypertension: Secondary | ICD-10-CM | POA: Diagnosis not present

## 2022-12-19 DIAGNOSIS — H35033 Hypertensive retinopathy, bilateral: Secondary | ICD-10-CM

## 2022-12-19 MED ORDER — AFLIBERCEPT 2MG/0.05ML IZ SOLN FOR KALEIDOSCOPE
2.0000 mg | INTRAVITREAL | Status: AC | PRN
Start: 2022-12-19 — End: 2022-12-19
  Administered 2022-12-19: 2 mg via INTRAVITREAL

## 2023-02-04 NOTE — Progress Notes (Signed)
[facility] - Clinic Note  02/06/2023     CHIEF COMPLAINT Patient presents for Retina Follow Up   HISTORY OF PRESENT ILLNESS: Sonya Anderson is a 57 y.o. female who presents to the clinic today for:   HPI     Retina Follow Up   Patient presents with  CRVO/BRVO.  In left eye.  This started weeks ago.  Duration of 7 weeks.  Since onset it is stable.  I, the attending physician,  performed the HPI with the patient and updated documentation appropriately.        Comments   Patient feels the vision is the same. She is not using eye drops.       Last edited by Rennis Chris, MD on 02/06/2023  3:57 PM.    Patient feels the vision is stable.   Referring physician: Smitty Cords, OD [address] [place], [place] [address]   HISTORICAL INFORMATION:   Selected notes from the MEDICAL RECORD NUMBER Referred by Dr. Daphine Deutscher for BRVO w/ CME OS LEE:  Ocular Hx- CME and BRVO OS PMH-    CURRENT MEDICATIONS: No current outpatient medications on file. (Ophthalmic Drugs)   No current facility-administered medications for this visit. (Ophthalmic Drugs)   Current Outpatient Medications (Other)  Medication Sig   omeprazole (PRILOSEC) 40 MG capsule Take by mouth.   No current facility-administered medications for this visit. (Other)   REVIEW OF SYSTEMS: ROS   Positive for: Eyes Negative for: Constitutional, Gastrointestinal, Neurological, Skin, Genitourinary, Musculoskeletal, HENT, Endocrine, Cardiovascular, Respiratory, Psychiatric, Allergic/Imm, Heme/Lymph Last edited by Charlette Caffey, COT on 02/06/2023  7:52 AM.       ALLERGIES No Known Allergies  PAST MEDICAL HISTORY History reviewed. No pertinent past medical history. Past Surgical History:  Procedure Laterality Date   CHOLECYSTECTOMY      FAMILY HISTORY History reviewed. No pertinent family history.  SOCIAL HISTORY Social History   Tobacco Use   Smoking status: Never   Smokeless tobacco:  Never  Vaping Use   Vaping status: Never Used  Substance Use Topics   Alcohol use: Never   Drug use: Never       OPHTHALMIC EXAM:  Base Eye Exam     Visual Acuity (Snellen - Linear)       Right Left   Dist cc 20/20 20/20    Correction: Glasses         Tonometry (Tonopen, 7:55 AM)       Right Left   Pressure 14 14         Pupils       Dark Light Shape React APD   Right 3 2 Round Brisk None   Left 3 2 Round Brisk None         Visual Fields       Left Right    Full Full         Extraocular Movement       Right Left    Full, Ortho Full, Ortho         Neuro/Psych     Oriented x3: Yes   Mood/Affect: Normal         Dilation     Both eyes: 1.0% Mydriacyl, 2.5% Phenylephrine @ 7:53 AM           Slit Lamp and Fundus Exam     Slit Lamp Exam       Right Left   Lids/Lashes Dermatochalasis - upper lid Dermatochalasis -  upper lid   Conjunctiva/Sclera White and quiet White and quiet   Cornea Trace tear film debris Trace tear film debris   Anterior Chamber deep, clear, narrow temporal angle deep, clear, narrow temporal angle   Iris Round and dilated Round and dilated   Lens Clear Clear   Anterior Vitreous mild syneresis mild syneresis         Fundus Exam       Right Left   Disc Pink and Sharp, mild PPP Pink and Sharp, mild PPP   C/D Ratio 0.2 0.2   Macula Flat, Good foveal reflex, RPE mottling, focal drusen IT to fovea, No heme or edema Blunted foveal reflex, flame hemes and DBH IT macula -- stably improved, trace cystic changes IT macula -- slightly increased   Vessels attenuated, mild tortuosity attenuated, Tortuous -- IT BRVO   Periphery Attached Attached           Refraction     Wearing Rx       Sphere Cylinder Axis   Right +2.75 +0.25 117   Left +3.25 +0.25 060           IMAGING AND PROCEDURES  Imaging and Procedures for 02/06/2023  OCT, Retina - OU - Both Eyes       Right Eye Quality was good. Central  Foveal Thickness: 271. Progression has been stable. Findings include normal foveal contour, no IRF, no SRF, retinal drusen , vitreomacular adhesion (Rare drusen).   Left Eye Quality was good. Central Foveal Thickness: 251. Progression has been stable. Findings include normal foveal contour, no SRF, intraretinal fluid, vitreomacular adhesion (Persistent cystic changes IT macula -- slightly increased).   Notes *Images captured and stored on drive  Diagnosis / Impression:  OD: NFP, no IRF/SRF OS: BRVO w/ CME --Persistent cystic changes IT macula -- slightly increased  Clinical management:  See below  Abbreviations: NFP - Normal foveal profile. CME - cystoid macular edema. PED - pigment epithelial detachment. IRF - intraretinal fluid. SRF - subretinal fluid. EZ - ellipsoid zone. ERM - epiretinal membrane. ORA - outer retinal atrophy. ORT - outer retinal tubulation. SRHM - subretinal hyper-reflective material. IRHM - intraretinal hyper-reflective material      Intravitreal Injection, Pharmacologic Agent - OS - Left Eye       Time Out 02/06/2023. 8:50 AM. Confirmed correct patient, procedure, site, and patient consented.   Anesthesia Topical anesthesia was used. Anesthetic medications included Lidocaine 2%, Proparacaine 0.5%.   Procedure Preparation included 5% betadine to ocular surface, eyelid speculum. A (32g) needle was used.   Injection: 6 mg faricimab-svoa 6 MG/0.05ML   Route: Intravitreal, Site: Left Eye   NDC: 86578-469-62, Lot: [id number], Expiration date: 10/23/2023, Waste: 0 mL   Post-op Post injection exam found visual acuity of at least counting fingers. The patient tolerated the procedure well. There were no complications. The patient received written and verbal post procedure care education. Post injection medications were not given.   Notes **SAMPLE MEDICATION ADMINISTERED**            ASSESSMENT/PLAN:    ICD-10-CM   1. Branch retinal vein occlusion of left  eye with macular edema  H34.8320 OCT, Retina - OU - Both Eyes    Intravitreal Injection, Pharmacologic Agent - OS - Left Eye    faricimab-svoa (VABYSMO) 6mg /0.62mL intravitreal injection    2. Essential hypertension  I10     3. Hypertensive retinopathy of both eyes  H35.033      BRVO w/ CME OS - s/p  IVA OS #1 (06.07.23), #2 (07.06.23), #3 (08.04.23), #4 (09.01.23) -- IVA resistance =========================================================== - s/p IVE OS #1 (10.06.23), #2 (11.06.23), #3 (12.04.23), #4 (01.08.24), #5 (02.26.24), #6 (04.15.24), #7 (06.03.24) #8 (07.26.24) **history of increased IRF at 7 wks on 09.13.24 and 07.26.24 visits** - FA (06.02.23) shows focal BRVO inferior macula - originally, pt asymptomatic and didn't realize she had an issue until her routine eye exam - BCVA OS 20/20 -- stable - OCT shows Persistent cystic changes IT macula -- slightly increased at 7 weeks **discussed decreased efficacy / resistance to Eylea and potential benefit of switching medication* - recommend IVV OS #1 (sample) today, 09.13.24 with f/u back to 5 weeks - pt wishes to proceed with IVV injection - RBA of procedure discussed, questions answered - IVV informed consent obtained and signed, OS (09.13.24) - see procedure note  - will check [employer] auth for next appt - f/u 5 weeks, DFE, OCT, possible injxn  2,3. Hypertensive retinopathy OU - discussed importance of tight BP control - monitor   Ophthalmic Meds Ordered this visit:  Meds ordered this encounter  Medications   faricimab-svoa (VABYSMO) 6mg /0.23mL intravitreal injection     Return in about 5 weeks (around 03/13/2023) for f/u BRVO OS, DFE, OCT.  There are no Patient Instructions on file for this visit.  This document serves as a record of services personally performed by Karie Chimera, MD, PhD. It was created on their behalf by Berlin Hun COT, an ophthalmic technician. The creation of this record is the provider's  dictation and/or activities during the visit.    Electronically signed by: Berlin Hun COT 09.11.24 3:58 PM  This document serves as a record of services personally performed by Karie Chimera, MD, PhD. It was created on their behalf by Glee Arvin. Manson Passey, OA an ophthalmic technician. The creation of this record is the provider's dictation and/or activities during the visit.    Electronically signed by: Glee Arvin. Manson Passey, OA 02/06/23 3:58 PM  Karie Chimera, M.D., Ph.D. Diseases & Surgery of the Retina and Vitreous [facility] 02/06/2023   I have reviewed the above documentation for accuracy and completeness, and I agree with the above. Karie Chimera, M.D., Ph.D. 02/06/23 3:59 PM   Abbreviations: M myopia (nearsighted); A astigmatism; H hyperopia (farsighted); P presbyopia; Mrx spectacle prescription;  CTL contact lenses; OD right eye; OS left eye; OU both eyes  XT exotropia; ET esotropia; PEK punctate epithelial keratitis; PEE punctate epithelial erosions; DES dry eye syndrome; MGD meibomian gland dysfunction; ATs artificial tears; PFAT's preservative free artificial tears; NSC nuclear sclerotic cataract; PSC posterior subcapsular cataract; ERM epi-retinal membrane; PVD posterior vitreous detachment; RD retinal detachment; DM diabetes mellitus; DR diabetic retinopathy; NPDR non-proliferative diabetic retinopathy; PDR proliferative diabetic retinopathy; CSME clinically significant macular edema; DME diabetic macular edema; dbh dot blot hemorrhages; CWS cotton wool spot; POAG primary open angle glaucoma; C/D cup-to-disc ratio; HVF humphrey visual field; GVF goldmann visual field; OCT optical coherence tomography; IOP intraocular pressure; BRVO Branch retinal vein occlusion; CRVO central retinal vein occlusion; CRAO central retinal artery occlusion; BRAO branch retinal artery occlusion; RT retinal tear; SB scleral buckle; PPV pars plana vitrectomy; VH Vitreous hemorrhage;  PRP panretinal laser photocoagulation; IVK intravitreal kenalog; VMT vitreomacular traction; MH Macular hole;  NVD neovascularization of the disc; NVE neovascularization elsewhere; AREDS age related eye disease study; ARMD age related macular degeneration; POAG primary open angle glaucoma; EBMD epithelial/anterior basement membrane dystrophy; ACIOL anterior chamber intraocular lens; IOL  intraocular lens; PCIOL posterior chamber intraocular lens; Phaco/IOL phacoemulsification with intraocular lens placement; PRK photorefractive keratectomy; LASIK laser assisted in situ keratomileusis; HTN hypertension; DM diabetes mellitus; COPD chronic obstructive pulmonary disease

## 2023-02-06 ENCOUNTER — Encounter (INDEPENDENT_AMBULATORY_CARE_PROVIDER_SITE_OTHER): Payer: Self-pay | Admitting: Ophthalmology

## 2023-02-06 ENCOUNTER — Ambulatory Visit (INDEPENDENT_AMBULATORY_CARE_PROVIDER_SITE_OTHER): Payer: BC Managed Care – PPO | Admitting: Ophthalmology

## 2023-02-06 DIAGNOSIS — H34832 Tributary (branch) retinal vein occlusion, left eye, with macular edema: Secondary | ICD-10-CM

## 2023-02-06 DIAGNOSIS — I10 Essential (primary) hypertension: Secondary | ICD-10-CM | POA: Diagnosis not present

## 2023-02-06 DIAGNOSIS — H35033 Hypertensive retinopathy, bilateral: Secondary | ICD-10-CM

## 2023-02-06 MED ORDER — FARICIMAB-SVOA 6 MG/0.05ML IZ SOLN
6.0000 mg | INTRAVITREAL | Status: AC | PRN
Start: 2023-02-06 — End: 2023-02-06
  Administered 2023-02-06: 6 mg via INTRAVITREAL

## 2023-03-11 NOTE — Progress Notes (Signed)
[facility] - Clinic Note  03/13/2023     CHIEF COMPLAINT Patient presents for Retina Follow Up   HISTORY OF PRESENT ILLNESS: Sonya Anderson is a 57 y.o. female who presents to the clinic today for:   HPI     Retina Follow Up   Patient presents with  CRVO/BRVO.  In left eye.  This started weeks ago.  Duration of 7 weeks.  Since onset it is stable.  I, the attending physician,  performed the HPI with the patient and updated documentation appropriately.        Comments   Patient feels the vision is the same. She is not using eye drops.       Last edited by Charlette Caffey, COT on 03/13/2023  7:41 AM.      Referring physician: Smitty Cords, OD [address] [place], [place] [address]   HISTORICAL INFORMATION:   Selected notes from the MEDICAL RECORD NUMBER Referred by Dr. Daphine Deutscher for BRVO w/ CME OS LEE:  Ocular Hx- CME and BRVO OS PMH-    CURRENT MEDICATIONS: No current outpatient medications on file. (Ophthalmic Drugs)   No current facility-administered medications for this visit. (Ophthalmic Drugs)   Current Outpatient Medications (Other)  Medication Sig   omeprazole (PRILOSEC) 40 MG capsule Take by mouth.   No current facility-administered medications for this visit. (Other)   REVIEW OF SYSTEMS: ROS   Positive for: Eyes Negative for: Constitutional, Gastrointestinal, Neurological, Skin, Genitourinary, Musculoskeletal, HENT, Endocrine, Cardiovascular, Respiratory, Psychiatric, Allergic/Imm, Heme/Lymph Last edited by Charlette Caffey, COT on 03/13/2023  7:41 AM.     ALLERGIES No Known Allergies  PAST MEDICAL HISTORY History reviewed. No pertinent past medical history. Past Surgical History:  Procedure Laterality Date   CHOLECYSTECTOMY      FAMILY HISTORY History reviewed. No pertinent family history.  SOCIAL HISTORY Social History   Tobacco Use   Smoking status: Never   Smokeless tobacco: Never  Vaping Use   Vaping  status: Never Used  Substance Use Topics   Alcohol use: Never   Drug use: Never       OPHTHALMIC EXAM:  Base Eye Exam     Visual Acuity (Snellen - Linear)       Right Left   Dist cc 20/20 20/20    Correction: Glasses         Tonometry (Tonopen, 7:42 AM)       Right Left   Pressure 14 16         Pupils       Dark Light Shape React APD   Right 3 2 Round Brisk None   Left 3 2 Round Brisk None         Visual Fields       Left Right    Full Full         Extraocular Movement       Right Left    Full, Ortho Full, Ortho         Neuro/Psych     Oriented x3: Yes   Mood/Affect: Normal         Dilation     Both eyes: 1.0% Mydriacyl, 2.5% Phenylephrine @ 7:41 AM           Slit Lamp and Fundus Exam     Slit Lamp Exam       Right Left   Lids/Lashes Dermatochalasis - upper lid Dermatochalasis - upper lid   Conjunctiva/Sclera White  and quiet White and quiet   Cornea Trace tear film debris Trace tear film debris   Anterior Chamber deep, clear, narrow temporal angle deep, clear, narrow temporal angle   Iris Round and dilated Round and dilated   Lens Clear Clear   Anterior Vitreous mild syneresis mild syneresis         Fundus Exam       Right Left   Disc Pink and Sharp, mild PPP Pink and Sharp, mild PPP   C/D Ratio 0.2 0.2   Macula Flat, Good foveal reflex, RPE mottling, focal drusen IT to fovea, No heme or edema Blunted foveal reflex, flame hemes and DBH IT macula -- stably improved, trace cystic changes IT macula -- improved   Vessels attenuated, Tortuous attenuated, Tortuous -- IT BRVO   Periphery Attached Attached           Refraction     Wearing Rx       Sphere Cylinder Axis   Right +2.75 +0.25 117   Left +3.25 +0.25 060           IMAGING AND PROCEDURES  Imaging and Procedures for 03/13/2023  OCT, Retina - OU - Both Eyes       Right Eye Quality was good. Central Foveal Thickness: 266. Progression has been  stable. Findings include normal foveal contour, no IRF, no SRF, retinal drusen , vitreomacular adhesion (Rare drusen).   Left Eye Quality was good. Central Foveal Thickness: 250. Progression has been stable. Findings include normal foveal contour, no SRF, intraretinal fluid, vitreomacular adhesion (Persistent cystic changes IT macula -- slightly improved).   Notes *Images captured and stored on drive  Diagnosis / Impression:  OD: NFP, no IRF/SRF OS: BRVO w/ CME --Persistent cystic changes IT macula -- slightly improved  Clinical management:  See below  Abbreviations: NFP - Normal foveal profile. CME - cystoid macular edema. PED - pigment epithelial detachment. IRF - intraretinal fluid. SRF - subretinal fluid. EZ - ellipsoid zone. ERM - epiretinal membrane. ORA - outer retinal atrophy. ORT - outer retinal tubulation. SRHM - subretinal hyper-reflective material. IRHM - intraretinal hyper-reflective material      Intravitreal Injection, Pharmacologic Agent - OS - Left Eye       Time Out 03/13/2023. 8:18 AM. Confirmed correct patient, procedure, site, and patient consented.   Anesthesia Topical anesthesia was used. Anesthetic medications included Lidocaine 2%, Proparacaine 0.5%.   Procedure Preparation included 5% betadine to ocular surface, eyelid speculum. A (32g) needle was used.   Injection: 6 mg faricimab-svoa 6 MG/0.05ML   Route: Intravitreal, Site: Left Eye   NDC: 16109-604-54, Lot: [id number], Expiration date: 08/23/2024, Waste: 0 mL   Post-op Post injection exam found visual acuity of at least counting fingers. The patient tolerated the procedure well. There were no complications. The patient received written and verbal post procedure care education. Post injection medications were not given.            ASSESSMENT/PLAN:    ICD-10-CM   1. Branch retinal vein occlusion of left eye with macular edema  H34.8320 OCT, Retina - OU - Both Eyes    Intravitreal Injection,  Pharmacologic Agent - OS - Left Eye    faricimab-svoa (VABYSMO) 6mg /0.10mL intravitreal injection    2. Essential hypertension  I10     3. Hypertensive retinopathy of both eyes  H35.033       BRVO w/ CME OS - s/p IVA OS #1 (06.07.23), #2 (07.06.23), #3 (08.04.23), #4 (09.01.23) -- IVA resistance =========================================================== -  s/p IVE OS #1 (10.06.23), #2 (11.06.23), #3 (12.04.23), #4 (01.08.24), #5 (02.26.24), #6 (04.15.24), #7 (06.03.24) #8 (07.26.24) -- IVE resistance - s/p IVV OS #1 (09.13.24 -- SAMPLE)  **history of increased IRF at 7 wks on 09.13.24 and 07.26.24 visits** - FA (06.02.23) shows focal BRVO inferior macula - originally, pt asymptomatic and didn't realize she had an issue until her routine eye exam - BCVA OS 20/20 -- stable - OCT shows Persistent cystic changes IT macula -- improved at 5 weeks - recommend IVV OS #2 today, 10.16.24 with f/u back to 5 weeks - pt wishes to proceed with IVV injection - RBA of procedure discussed, questions answered - IVV informed consent obtained and signed, OS (09.13.24) - see procedure note  - Vabysmo auth obtained as of 10.18.24 - f/u 5 weeks, DFE, OCT, possible injxn  2,3. Hypertensive retinopathy OU - discussed importance of tight BP control - monitor   Ophthalmic Meds Ordered this visit:  Meds ordered this encounter  Medications   faricimab-svoa (VABYSMO) 6mg /0.41mL intravitreal injection     Return in about 5 weeks (around 04/17/2023) for f/u BRVO OS, DFE, OCT.  There are no Patient Instructions on file for this visit.  This document serves as a record of services personally performed by Karie Chimera, MD, PhD. It was created on their behalf by Berlin Hun COT, an ophthalmic technician. The creation of this record is the provider's dictation and/or activities during the visit.    Electronically signed by: Berlin Hun COT 10.16.24 4:58 PM  This document serves as a record  of services personally performed by Karie Chimera, MD, PhD. It was created on their behalf by Glee Arvin. Manson Passey, OA an ophthalmic technician. The creation of this record is the provider's dictation and/or activities during the visit.    Electronically signed by: Glee Arvin. Manson Passey, OA 03/13/23 4:58 PM  Karie Chimera, M.D., Ph.D. Diseases & Surgery of the Retina and Vitreous [facility] 03/13/2023   I have reviewed the above documentation for accuracy and completeness, and I agree with the above. Karie Chimera, M.D., Ph.D. 03/13/23 4:59 PM   Abbreviations: M myopia (nearsighted); A astigmatism; H hyperopia (farsighted); P presbyopia; Mrx spectacle prescription;  CTL contact lenses; OD right eye; OS left eye; OU both eyes  XT exotropia; ET esotropia; PEK punctate epithelial keratitis; PEE punctate epithelial erosions; DES dry eye syndrome; MGD meibomian gland dysfunction; ATs artificial tears; PFAT's preservative free artificial tears; NSC nuclear sclerotic cataract; PSC posterior subcapsular cataract; ERM epi-retinal membrane; PVD posterior vitreous detachment; RD retinal detachment; DM diabetes mellitus; DR diabetic retinopathy; NPDR non-proliferative diabetic retinopathy; PDR proliferative diabetic retinopathy; CSME clinically significant macular edema; DME diabetic macular edema; dbh dot blot hemorrhages; CWS cotton wool spot; POAG primary open angle glaucoma; C/D cup-to-disc ratio; HVF humphrey visual field; GVF goldmann visual field; OCT optical coherence tomography; IOP intraocular pressure; BRVO Branch retinal vein occlusion; CRVO central retinal vein occlusion; CRAO central retinal artery occlusion; BRAO branch retinal artery occlusion; RT retinal tear; SB scleral buckle; PPV pars plana vitrectomy; VH Vitreous hemorrhage; PRP panretinal laser photocoagulation; IVK intravitreal kenalog; VMT vitreomacular traction; MH Macular hole;  NVD neovascularization of the disc; NVE  neovascularization elsewhere; AREDS age related eye disease study; ARMD age related macular degeneration; POAG primary open angle glaucoma; EBMD epithelial/anterior basement membrane dystrophy; ACIOL anterior chamber intraocular lens; IOL intraocular lens; PCIOL posterior chamber intraocular lens; Phaco/IOL phacoemulsification with intraocular lens placement; PRK photorefractive keratectomy; LASIK laser assisted  in situ keratomileusis; HTN hypertension; DM diabetes mellitus; COPD chronic obstructive pulmonary disease

## 2023-03-13 ENCOUNTER — Ambulatory Visit (INDEPENDENT_AMBULATORY_CARE_PROVIDER_SITE_OTHER): Payer: BC Managed Care – PPO | Admitting: Ophthalmology

## 2023-03-13 ENCOUNTER — Encounter (INDEPENDENT_AMBULATORY_CARE_PROVIDER_SITE_OTHER): Payer: Self-pay | Admitting: Ophthalmology

## 2023-03-13 DIAGNOSIS — H34832 Tributary (branch) retinal vein occlusion, left eye, with macular edema: Secondary | ICD-10-CM | POA: Diagnosis not present

## 2023-03-13 DIAGNOSIS — I10 Essential (primary) hypertension: Secondary | ICD-10-CM | POA: Diagnosis not present

## 2023-03-13 DIAGNOSIS — H35033 Hypertensive retinopathy, bilateral: Secondary | ICD-10-CM | POA: Diagnosis not present

## 2023-03-13 MED ORDER — FARICIMAB-SVOA 6 MG/0.05ML IZ SOSY
6.0000 mg | PREFILLED_SYRINGE | INTRAVITREAL | Status: AC | PRN
Start: 2023-03-13 — End: 2023-03-13
  Administered 2023-03-13: 6 mg via INTRAVITREAL

## 2023-04-16 NOTE — Progress Notes (Signed)
[facility] - Clinic Note  04/17/2023     CHIEF COMPLAINT Patient presents for Retina Follow Up   HISTORY OF PRESENT ILLNESS: Sonya Anderson is a 57 y.o. female who presents to the clinic today for:   HPI     Retina Follow Up   Patient presents with  CRVO/BRVO.  In left eye.  This started 5 weeks ago.  I, the attending physician,  performed the HPI with the patient and updated documentation appropriately.        Comments   Patient here for 5 weeks retina follow up for BRVO OS. Patient states vision doing good. No eye pain. Not using any drops.       Last edited by Rennis Chris, MD on 04/17/2023  9:17 AM.     Referring physician: Smitty Cords, OD [address] [place], [place] [address]   HISTORICAL INFORMATION:   Selected notes from the MEDICAL RECORD NUMBER Referred by Dr. Daphine Deutscher for BRVO w/ CME OS LEE:  Ocular Hx- CME and BRVO OS PMH-    CURRENT MEDICATIONS: No current outpatient medications on file. (Ophthalmic Drugs)   No current facility-administered medications for this visit. (Ophthalmic Drugs)   Current Outpatient Medications (Other)  Medication Sig   omeprazole (PRILOSEC) 40 MG capsule Take by mouth.   No current facility-administered medications for this visit. (Other)   REVIEW OF SYSTEMS: ROS   Positive for: Eyes Negative for: Constitutional, Gastrointestinal, Neurological, Skin, Genitourinary, Musculoskeletal, HENT, Endocrine, Cardiovascular, Respiratory, Psychiatric, Allergic/Imm, Heme/Lymph Last edited by Laddie Aquas, COA on 04/17/2023  8:04 AM.      ALLERGIES No Known Allergies  PAST MEDICAL HISTORY History reviewed. No pertinent past medical history. Past Surgical History:  Procedure Laterality Date   CHOLECYSTECTOMY      FAMILY HISTORY History reviewed. No pertinent family history.  SOCIAL HISTORY Social History   Tobacco Use   Smoking status: Never   Smokeless tobacco: Never  Vaping Use   Vaping  status: Never Used  Substance Use Topics   Alcohol use: Never   Drug use: Never       OPHTHALMIC EXAM:  Base Eye Exam     Visual Acuity (Snellen - Linear)       Right Left   Dist cc 20/20 20/20    Correction: Glasses         Tonometry (Tonopen, 8:02 AM)       Right Left   Pressure 13 12         Pupils       Dark Light Shape React APD   Right 3 2 Round Brisk None   Left 3 2 Round Brisk None         Visual Fields (Counting fingers)       Left Right    Full Full         Extraocular Movement       Right Left    Full, Ortho Full, Ortho         Neuro/Psych     Oriented x3: Yes   Mood/Affect: Normal         Dilation     Both eyes: 1.0% Mydriacyl, 2.5% Phenylephrine @ 8:02 AM           Slit Lamp and Fundus Exam     Slit Lamp Exam       Right Left   Lids/Lashes Dermatochalasis - upper lid Dermatochalasis - upper lid   Conjunctiva/Sclera  White and quiet White and quiet   Cornea Trace tear film debris Trace tear film debris   Anterior Chamber deep, clear, narrow temporal angle deep, clear, narrow temporal angle   Iris Round and dilated Round and dilated   Lens Clear Clear   Anterior Vitreous mild syneresis mild syneresis         Fundus Exam       Right Left   Disc Pink and Sharp, mild PPP Pink and Sharp, mild PPP   C/D Ratio 0.2 0.2   Macula Flat, Good foveal reflex, RPE mottling, focal drusen IT to fovea, No heme or edema Blunted foveal reflex, flame hemes and DBH IT macula -- stably improved, trace cystic changes IT macula -- improving   Vessels attenuated, Tortuous attenuated, Tortuous -- IT BRVO   Periphery Attached Attached           Refraction     Wearing Rx       Sphere Cylinder Axis   Right +2.75 +0.25 117   Left +3.25 +0.25 060           IMAGING AND PROCEDURES  Imaging and Procedures for 04/17/2023  OCT, Retina - OU - Both Eyes       Right Eye Quality was good. Central Foveal Thickness: 265.  Progression has been stable. Findings include normal foveal contour, no IRF, no SRF, retinal drusen , vitreomacular adhesion (Rare drusen).   Left Eye Quality was good. Central Foveal Thickness: 254. Progression has improved. Findings include normal foveal contour, no SRF, intraretinal fluid, vitreomacular adhesion (Mild interval improvement in cystic changes IT macula ).   Notes *Images captured and stored on drive  Diagnosis / Impression:  OD: NFP, no IRF/SRF OS: BRVO w/ CME -- Mild interval improvement in cystic changes IT macula   Clinical management:  See below  Abbreviations: NFP - Normal foveal profile. CME - cystoid macular edema. PED - pigment epithelial detachment. IRF - intraretinal fluid. SRF - subretinal fluid. EZ - ellipsoid zone. ERM - epiretinal membrane. ORA - outer retinal atrophy. ORT - outer retinal tubulation. SRHM - subretinal hyper-reflective material. IRHM - intraretinal hyper-reflective material      Intravitreal Injection, Pharmacologic Agent - OS - Left Eye       Time Out 04/17/2023. 8:55 AM. Confirmed correct patient, procedure, site, and patient consented.   Anesthesia Topical anesthesia was used. Anesthetic medications included Lidocaine 2%, Proparacaine 0.5%.   Procedure Preparation included 5% betadine to ocular surface, eyelid speculum. A (32g) needle was used.   Injection: 6 mg faricimab-svoa 6 MG/0.05ML   Route: Intravitreal, Site: Left Eye   NDC: 47829-562-13, Lot: [id number], Expiration date: 03/25/2024, Waste: 0 mL   Post-op Post injection exam found visual acuity of at least counting fingers. The patient tolerated the procedure well. There were no complications. The patient received written and verbal post procedure care education. Post injection medications were not given.            ASSESSMENT/PLAN:    ICD-10-CM   1. Branch retinal vein occlusion of left eye with macular edema  H34.8320 OCT, Retina - OU - Both Eyes     Intravitreal Injection, Pharmacologic Agent - OS - Left Eye    faricimab-svoa (VABYSMO) 6mg /0.73mL intravitreal injection    2. Essential hypertension  I10     3. Hypertensive retinopathy of both eyes  H35.033      BRVO w/ CME OS - s/p IVA OS #1 (06.07.23), #2 (07.06.23), #3 (08.04.23), #4 (09.01.23) --  IVA resistance =========================================================== - s/p IVE OS #1 (10.06.23), #2 (11.06.23), #3 (12.04.23), #4 (01.08.24), #5 (02.26.24), #6 (04.15.24), #7 (06.03.24) #8 (07.26.24) -- IVE resistance - s/p IVV OS #1 (09.13.24 -- SAMPLE) , #2 (10.16.24) **history of increased IRF at 7 wks on 09.13.24 and 07.26.24 visits** - FA (06.02.23) shows focal BRVO inferior macula - originally, pt asymptomatic and didn't realize she had an issue until her routine eye exam - BCVA OS 20/20 -- stable - OCT shows mild interval improvement in cystic changes IT macula at 5 weeks - recommend IVV OS #3 today, 11.22.4 with f/u extended to 5-6 weeks - pt wishes to proceed with IVV injection - RBA of procedure discussed, questions answered - IVV informed consent obtained and signed, OS (09.13.24) - see procedure note  - Vabysmo auth obtained as of 10.18.24 - f/u 5-6 weeks, DFE, OCT, possible injxn  2,3. Hypertensive retinopathy OU - discussed importance of tight BP control - monitor   Ophthalmic Meds Ordered this visit:  Meds ordered this encounter  Medications   faricimab-svoa (VABYSMO) 6mg /0.65mL intravitreal injection     Return for f/u 5-6 weeks, BRVO OS, DFE, OCT.  There are no Patient Instructions on file for this visit.  This document serves as a record of services personally performed by Karie Chimera, MD, PhD. It was created on their behalf by Charlette Caffey, COT an ophthalmic technician. The creation of this record is the provider's dictation and/or activities during the visit.    Electronically signed by:  Charlette Caffey, COT  04/17/23 9:24 AM  This  document serves as a record of services personally performed by Karie Chimera, MD, PhD. It was created on their behalf by Glee Arvin. Manson Passey, OA an ophthalmic technician. The creation of this record is the provider's dictation and/or activities during the visit.    Electronically signed by: Glee Arvin. Manson Passey, OA 04/17/23 9:24 AM  Karie Chimera, M.D., Ph.D. Diseases & Surgery of the Retina and Vitreous [facility]  I have reviewed the above documentation for accuracy and completeness, and I agree with the above. Karie Chimera, M.D., Ph.D. 04/17/23 9:27 AM   Abbreviations: M myopia (nearsighted); A astigmatism; H hyperopia (farsighted); P presbyopia; Mrx spectacle prescription;  CTL contact lenses; OD right eye; OS left eye; OU both eyes  XT exotropia; ET esotropia; PEK punctate epithelial keratitis; PEE punctate epithelial erosions; DES dry eye syndrome; MGD meibomian gland dysfunction; ATs artificial tears; PFAT's preservative free artificial tears; NSC nuclear sclerotic cataract; PSC posterior subcapsular cataract; ERM epi-retinal membrane; PVD posterior vitreous detachment; RD retinal detachment; DM diabetes mellitus; DR diabetic retinopathy; NPDR non-proliferative diabetic retinopathy; PDR proliferative diabetic retinopathy; CSME clinically significant macular edema; DME diabetic macular edema; dbh dot blot hemorrhages; CWS cotton wool spot; POAG primary open angle glaucoma; C/D cup-to-disc ratio; HVF humphrey visual field; GVF goldmann visual field; OCT optical coherence tomography; IOP intraocular pressure; BRVO Branch retinal vein occlusion; CRVO central retinal vein occlusion; CRAO central retinal artery occlusion; BRAO branch retinal artery occlusion; RT retinal tear; SB scleral buckle; PPV pars plana vitrectomy; VH Vitreous hemorrhage; PRP panretinal laser photocoagulation; IVK intravitreal kenalog; VMT vitreomacular traction; MH Macular hole;  NVD neovascularization of  the disc; NVE neovascularization elsewhere; AREDS age related eye disease study; ARMD age related macular degeneration; POAG primary open angle glaucoma; EBMD epithelial/anterior basement membrane dystrophy; ACIOL anterior chamber intraocular lens; IOL intraocular lens; PCIOL posterior chamber intraocular lens; Phaco/IOL phacoemulsification with intraocular lens placement; PRK  photorefractive keratectomy; LASIK laser assisted in situ keratomileusis; HTN hypertension; DM diabetes mellitus; COPD chronic obstructive pulmonary disease

## 2023-04-17 ENCOUNTER — Encounter (INDEPENDENT_AMBULATORY_CARE_PROVIDER_SITE_OTHER): Payer: Self-pay | Admitting: Ophthalmology

## 2023-04-17 ENCOUNTER — Ambulatory Visit (INDEPENDENT_AMBULATORY_CARE_PROVIDER_SITE_OTHER): Payer: BC Managed Care – PPO | Admitting: Ophthalmology

## 2023-04-17 DIAGNOSIS — H35033 Hypertensive retinopathy, bilateral: Secondary | ICD-10-CM | POA: Diagnosis not present

## 2023-04-17 DIAGNOSIS — I10 Essential (primary) hypertension: Secondary | ICD-10-CM

## 2023-04-17 DIAGNOSIS — H34832 Tributary (branch) retinal vein occlusion, left eye, with macular edema: Secondary | ICD-10-CM

## 2023-04-17 MED ORDER — FARICIMAB-SVOA 6 MG/0.05ML IZ SOSY
6.0000 mg | PREFILLED_SYRINGE | INTRAVITREAL | Status: AC | PRN
Start: 2023-04-17 — End: 2023-04-17
  Administered 2023-04-17: 6 mg via INTRAVITREAL

## 2023-05-28 NOTE — Progress Notes (Signed)
 [facility] - Clinic Note  05/29/2023     CHIEF COMPLAINT Patient presents for Retina Follow Up   HISTORY OF PRESENT ILLNESS: Sonya Anderson is a 58 y.o. female who presents to the clinic today for:   HPI     Retina Follow Up   Patient presents with  CRVO/BRVO.  In left eye.  This started 5 weeks ago.  I, the attending physician,  performed the HPI with the patient and updated documentation appropriately.        Comments   Patient feels the vision is the same. She is not using eye drops.       Last edited by Valdemar Rogue, MD on 05/29/2023 12:08 PM.    Pt states vision is fine  Referring physician: Celestine Lunger, OD [address] [place], [place] [address]   HISTORICAL INFORMATION:   Selected notes from the MEDICAL RECORD NUMBER Referred by Dr. Lunger for BRVO w/ CME OS LEE:  Ocular Hx- CME and BRVO OS PMH-    CURRENT MEDICATIONS: No current outpatient medications on file. (Ophthalmic Drugs)   No current facility-administered medications for this visit. (Ophthalmic Drugs)   Current Outpatient Medications (Other)  Medication Sig   omeprazole (PRILOSEC) 40 MG capsule Take by mouth.   No current facility-administered medications for this visit. (Other)   REVIEW OF SYSTEMS: ROS   Positive for: Eyes Negative for: Constitutional, Gastrointestinal, Neurological, Skin, Genitourinary, Musculoskeletal, HENT, Endocrine, Cardiovascular, Respiratory, Psychiatric, Allergic/Imm, Heme/Lymph Last edited by Myra Wanda SAILOR, COT on 05/29/2023  7:56 AM.       ALLERGIES No Known Allergies  PAST MEDICAL HISTORY History reviewed. No pertinent past medical history. Past Surgical History:  Procedure Laterality Date   CHOLECYSTECTOMY      FAMILY HISTORY History reviewed. No pertinent family history.  SOCIAL HISTORY Social History   Tobacco Use   Smoking status: Never   Smokeless tobacco: Never  Vaping Use   Vaping status: Never Used  Substance Use  Topics   Alcohol use: Never   Drug use: Never       OPHTHALMIC EXAM:  Base Eye Exam     Visual Acuity (Snellen - Linear)       Right Left   Dist cc 20/20 20/20    Correction: Glasses         Tonometry (Tonopen, 7:58 AM)       Right Left   Pressure 14 12         Pupils       Dark Light Shape React APD   Right 3 2 Round Brisk None   Left 3 2 Round Brisk None         Visual Fields       Left Right    Full Full         Extraocular Movement       Right Left    Full, Ortho Full, Ortho         Neuro/Psych     Oriented x3: Yes   Mood/Affect: Normal         Dilation     Both eyes: 1.0% Mydriacyl, 2.5% Phenylephrine @ 7:57 AM           Slit Lamp and Fundus Exam     Slit Lamp Exam       Right Left   Lids/Lashes Dermatochalasis - upper lid Dermatochalasis - upper lid   Conjunctiva/Sclera White and quiet White and quiet  Cornea Trace tear film debris Trace tear film debris   Anterior Chamber deep, clear, narrow temporal angle deep, clear, narrow temporal angle   Iris Round and dilated Round and dilated   Lens Clear Clear   Anterior Vitreous mild syneresis mild syneresis         Fundus Exam       Right Left   Disc Pink and Sharp, mild PPP Pink and Sharp, mild PPP   C/D Ratio 0.2 0.2   Macula Flat, Good foveal reflex, RPE mottling, focal drusen IT to fovea, No heme or edema Blunted foveal reflex, flame hemes and DBH IT macula -- stably improved, trace cystic changes IT macula -- slightly increased   Vessels attenuated, Tortuous attenuated, Tortuous -- IT BRVO   Periphery Attached Attached           Refraction     Wearing Rx       Sphere Cylinder Axis   Right +2.75 +0.25 117   Left +3.25 +0.25 060           IMAGING AND PROCEDURES  Imaging and Procedures for 05/29/2023  OCT, Retina - OU - Both Eyes       Right Eye Quality was good. Central Foveal Thickness: 268. Progression has been stable. Findings include normal  foveal contour, no IRF, no SRF, retinal drusen , vitreomacular adhesion (Rare drusen).   Left Eye Quality was good. Central Foveal Thickness: 250. Progression has worsened. Findings include normal foveal contour, no SRF, intraretinal fluid, vitreomacular adhesion (Persistent cystic changes IT macula -- slightly increased).   Notes *Images captured and stored on drive  Diagnosis / Impression:  OD: NFP, no IRF/SRF OS: BRVO w/ CME --  Persistent cystic changes IT macula -- slightly increased  Clinical management:  See below  Abbreviations: NFP - Normal foveal profile. CME - cystoid macular edema. PED - pigment epithelial detachment. IRF - intraretinal fluid. SRF - subretinal fluid. EZ - ellipsoid zone. ERM - epiretinal membrane. ORA - outer retinal atrophy. ORT - outer retinal tubulation. SRHM - subretinal hyper-reflective material. IRHM - intraretinal hyper-reflective material      Intravitreal Injection, Pharmacologic Agent - OS - Left Eye       Time Out 05/29/2023. 9:11 AM. Confirmed correct patient, procedure, site, and patient consented.   Anesthesia Topical anesthesia was used. Anesthetic medications included Lidocaine 2%, Proparacaine 0.5%.   Procedure Preparation included 5% betadine to ocular surface, eyelid speculum. A (32g) needle was used.   Injection: 6 mg faricimab -svoa 6 MG/0.05ML   Route: Intravitreal, Site: Left Eye   NDC: 49757-903-93, Lot: [id number], Expiration date: 09/22/2024, Waste: 0 mL   Post-op Post injection exam found visual acuity of at least counting fingers. The patient tolerated the procedure well. There were no complications. The patient received written and verbal post procedure care education. Post injection medications were not given.             ASSESSMENT/PLAN:    ICD-10-CM   1. Branch retinal vein occlusion of left eye with macular edema  H34.8320 OCT, Retina - OU - Both Eyes    Intravitreal Injection, Pharmacologic Agent - OS - Left  Eye    faricimab -svoa (VABYSMO ) 6mg /0.9mL intravitreal injection    2. Essential hypertension  I10     3. Hypertensive retinopathy of both eyes  H35.033       BRVO w/ CME OS - s/p IVA OS #1 (06.07.23), #2 (07.06.23), #3 (08.04.23), #4 (09.01.23) -- IVA resistance =========================================================== - s/p IVE  OS #1 (10.06.23), #2 (11.06.23), #3 (12.04.23), #4 (01.08.24), #5 (02.26.24), #6 (04.15.24), #7 (06.03.24) #8 (07.26.24) -- IVE resistance - s/p IVV OS #1 (09.13.24 -- SAMPLE) , #2 (10.16.24), #3 (11.22.24) **history of increased IRF at 7 wks on 09.13.24 and 07.26.24 visits; at 6 wks on 01.03.25** - FA (06.02.23) shows focal BRVO inferior macula - originally, pt asymptomatic and didn't realize she had an issue until her routine eye exam - BCVA OS 20/20 -- stable - OCT shows mild Persistent cystic changes IT macula -- slightly increased at 6 weeks - recommend IVV OS #4 today, 1.03.25 with f/u back to 5 weeks - pt wishes to proceed with IVV injection - RBA of procedure discussed, questions answered - IVV informed consent obtained and signed, OS (09.13.24) - see procedure note  - Vabysmo  auth obtained as of 10.18.24 - f/u 5 weeks, DFE, OCT, possible injxn  2,3. Hypertensive retinopathy OU - discussed importance of tight BP control - monitor   Ophthalmic Meds Ordered this visit:  Meds ordered this encounter  Medications   faricimab -svoa (VABYSMO ) 6mg /0.7mL intravitreal injection     Return in about 5 weeks (around 07/03/2023) for f/u BRVO OS, DFE, OCT, Possible Injxn.  There are no Patient Instructions on file for this visit.  This document serves as a record of services personally performed by Redell JUDITHANN Hans, MD, PhD. It was created on their behalf by Alan PARAS. Delores, OA an ophthalmic technician. The creation of this record is the provider's dictation and/or activities during the visit.    Electronically signed by: Alan PARAS. Delores, OA 05/29/23  12:11 PM  Redell JUDITHANN Hans, M.D., Ph.D. Diseases & Surgery of the Retina and Vitreous [facility]  I have reviewed the above documentation for accuracy and completeness, and I agree with the above. Redell JUDITHANN Hans, M.D., Ph.D. 05/29/23 12:12 PM  Abbreviations: M myopia (nearsighted); A astigmatism; H hyperopia (farsighted); P presbyopia; Mrx spectacle prescription;  CTL contact lenses; OD right eye; OS left eye; OU both eyes  XT exotropia; ET esotropia; PEK punctate epithelial keratitis; PEE punctate epithelial erosions; DES dry eye syndrome; MGD meibomian gland dysfunction; ATs artificial tears; PFAT's preservative free artificial tears; NSC nuclear sclerotic cataract; PSC posterior subcapsular cataract; ERM epi-retinal membrane; PVD posterior vitreous detachment; RD retinal detachment; DM diabetes mellitus; DR diabetic retinopathy; NPDR non-proliferative diabetic retinopathy; PDR proliferative diabetic retinopathy; CSME clinically significant macular edema; DME diabetic macular edema; dbh dot blot hemorrhages; CWS cotton wool spot; POAG primary open angle glaucoma; C/D cup-to-disc ratio; HVF humphrey visual field; GVF goldmann visual field; OCT optical coherence tomography; IOP intraocular pressure; BRVO Branch retinal vein occlusion; CRVO central retinal vein occlusion; CRAO central retinal artery occlusion; BRAO branch retinal artery occlusion; RT retinal tear; SB scleral buckle; PPV pars plana vitrectomy; VH Vitreous hemorrhage; PRP panretinal laser photocoagulation; IVK intravitreal kenalog; VMT vitreomacular traction; MH Macular hole;  NVD neovascularization of the disc; NVE neovascularization elsewhere; AREDS age related eye disease study; ARMD age related macular degeneration; POAG primary open angle glaucoma; EBMD epithelial/anterior basement membrane dystrophy; ACIOL anterior chamber intraocular lens; IOL intraocular lens; PCIOL posterior chamber intraocular lens; Phaco/IOL  phacoemulsification with intraocular lens placement; PRK photorefractive keratectomy; LASIK laser assisted in situ keratomileusis; HTN hypertension; DM diabetes mellitus; COPD chronic obstructive pulmonary disease

## 2023-05-29 ENCOUNTER — Ambulatory Visit (INDEPENDENT_AMBULATORY_CARE_PROVIDER_SITE_OTHER): Payer: BC Managed Care – PPO | Admitting: Ophthalmology

## 2023-05-29 ENCOUNTER — Encounter (INDEPENDENT_AMBULATORY_CARE_PROVIDER_SITE_OTHER): Payer: Self-pay | Admitting: Ophthalmology

## 2023-05-29 DIAGNOSIS — I10 Essential (primary) hypertension: Secondary | ICD-10-CM | POA: Diagnosis not present

## 2023-05-29 DIAGNOSIS — H34832 Tributary (branch) retinal vein occlusion, left eye, with macular edema: Secondary | ICD-10-CM | POA: Diagnosis not present

## 2023-05-29 DIAGNOSIS — H35033 Hypertensive retinopathy, bilateral: Secondary | ICD-10-CM

## 2023-05-29 MED ORDER — FARICIMAB-SVOA 6 MG/0.05ML IZ SOSY
6.0000 mg | PREFILLED_SYRINGE | INTRAVITREAL | Status: AC | PRN
  Administered 2023-05-29: 6 mg via INTRAVITREAL

## 2023-07-02 NOTE — Progress Notes (Signed)
 [facility] - Clinic Note  07/03/2023     CHIEF COMPLAINT Patient presents for Retina Follow Up   HISTORY OF PRESENT ILLNESS: Sonya Anderson is a 58 y.o. female who presents to the clinic today for:   HPI     Retina Follow Up   Patient presents with  CRVO/BRVO.  In left eye.  This started 5 weeks ago.  I, the attending physician,  performed the HPI with the patient and updated documentation appropriately.        Comments   Patient states the vision is the same. She is not using eye drops.       Last edited by Valdemar Rogue, MD on 07/03/2023 12:41 PM.    Pt states vision is stable  Referring physician: Celestine Lunger, OD [address] [place], [place] [address]   HISTORICAL INFORMATION:   Selected notes from the MEDICAL RECORD NUMBER Referred by Dr. Lunger for BRVO w/ CME OS LEE:  Ocular Hx- CME and BRVO OS PMH-    CURRENT MEDICATIONS: No current outpatient medications on file. (Ophthalmic Drugs)   No current facility-administered medications for this visit. (Ophthalmic Drugs)   Current Outpatient Medications (Other)  Medication Sig   omeprazole (PRILOSEC) 40 MG capsule Take by mouth.   No current facility-administered medications for this visit. (Other)   REVIEW OF SYSTEMS: ROS   Positive for: Eyes Negative for: Constitutional, Gastrointestinal, Neurological, Skin, Genitourinary, Musculoskeletal, HENT, Endocrine, Cardiovascular, Respiratory, Psychiatric, Allergic/Imm, Heme/Lymph Last edited by Myra Wanda SAILOR, COT on 07/03/2023  8:06 AM.     ALLERGIES No Known Allergies  PAST MEDICAL HISTORY History reviewed. No pertinent past medical history. Past Surgical History:  Procedure Laterality Date   CHOLECYSTECTOMY     FAMILY HISTORY History reviewed. No pertinent family history.  SOCIAL HISTORY Social History   Tobacco Use   Smoking status: Never   Smokeless tobacco: Never  Vaping Use   Vaping status: Never Used  Substance Use  Topics   Alcohol use: Never   Drug use: Never       OPHTHALMIC EXAM:  Base Eye Exam     Visual Acuity (Snellen - Linear)       Right Left   Dist cc 20/20 20/20    Correction: Glasses         Tonometry (Tonopen, 8:08 AM)       Right Left   Pressure 13 10         Pupils       Dark Light Shape React APD   Right 3 2 Round Brisk None   Left 3 2 Round Brisk None         Visual Fields       Left Right    Full Full         Extraocular Movement       Right Left    Full, Ortho Full, Ortho         Neuro/Psych     Oriented x3: Yes   Mood/Affect: Normal         Dilation     Both eyes: 1.0% Mydriacyl, 2.5% Phenylephrine @ 8:07 AM           Slit Lamp and Fundus Exam     Slit Lamp Exam       Right Left   Lids/Lashes Dermatochalasis - upper lid Dermatochalasis - upper lid   Conjunctiva/Sclera White and quiet White and quiet   Cornea Trace  tear film debris Trace tear film debris   Anterior Chamber deep, clear, narrow temporal angle deep, clear, narrow temporal angle   Iris Round and dilated Round and dilated   Lens Clear Clear   Anterior Vitreous mild syneresis mild syneresis         Fundus Exam       Right Left   Disc Pink and Sharp, mild PPP Pink and Sharp, mild PPP   C/D Ratio 0.2 0.2   Macula Flat, Good foveal reflex, RPE mottling, focal drusen IT to fovea, No heme or edema Blunted foveal reflex, flame hemes and DBH IT macula -- stably improved, trace cystic changes IT macula -- slightly improved   Vessels attenuated, Tortuous attenuated, Tortuous -- IT BRVO   Periphery Attached Attached           Refraction     Wearing Rx       Sphere Cylinder Axis   Right +2.75 +0.25 117   Left +3.25 +0.25 060           IMAGING AND PROCEDURES  Imaging and Procedures for 07/03/2023  OCT, Retina - OU - Both Eyes       Right Eye Quality was good. Central Foveal Thickness: 262. Progression has been stable. Findings include normal  foveal contour, no IRF, no SRF, retinal drusen , vitreomacular adhesion (Rare drusen).   Left Eye Quality was good. Central Foveal Thickness: 251. Progression has improved. Findings include normal foveal contour, no SRF, intraretinal fluid, vitreomacular adhesion (Mild interval improvement in cystic changes IT macula).   Notes *Images captured and stored on drive  Diagnosis / Impression:  OD: NFP, no IRF/SRF OS: BRVO w/ CME --  Mild interval improvement in cystic changes IT macula  Clinical management:  See below  Abbreviations: NFP - Normal foveal profile. CME - cystoid macular edema. PED - pigment epithelial detachment. IRF - intraretinal fluid. SRF - subretinal fluid. EZ - ellipsoid zone. ERM - epiretinal membrane. ORA - outer retinal atrophy. ORT - outer retinal tubulation. SRHM - subretinal hyper-reflective material. IRHM - intraretinal hyper-reflective material      Intravitreal Injection, Pharmacologic Agent - OS - Left Eye       Time Out 07/03/2023. 8:37 AM. Confirmed correct patient, procedure, site, and patient consented.   Anesthesia Topical anesthesia was used. Anesthetic medications included Lidocaine 2%, Proparacaine 0.5%.   Procedure Preparation included 5% betadine to ocular surface, eyelid speculum. A (32g) needle was used.   Injection: 6 mg faricimab -svoa 6 MG/0.05ML   Route: Intravitreal, Site: Left Eye   NDC: 49757-903-93, Lot: [id number], Expiration date: 09/22/2024, Waste: 0 mL   Post-op Post injection exam found visual acuity of at least counting fingers. The patient tolerated the procedure well. There were no complications. The patient received written and verbal post procedure care education. Post injection medications were not given.            ASSESSMENT/PLAN:    ICD-10-CM   1. Branch retinal vein occlusion of left eye with macular edema  H34.8320 OCT, Retina - OU - Both Eyes    Intravitreal Injection, Pharmacologic Agent - OS - Left Eye     faricimab -svoa (VABYSMO ) 6mg /0.9mL intravitreal injection    2. Essential hypertension  I10     3. Hypertensive retinopathy of both eyes  H35.033      BRVO w/ CME OS - s/p IVA OS #1 (06.07.23), #2 (07.06.23), #3 (08.04.23), #4 (09.01.23) -- IVA resistance =========================================================== - s/p IVE OS #1 (10.06.23), #2 (  11.06.23), #3 (12.04.23), #4 (01.08.24), #5 (02.26.24), #6 (04.15.24), #7 (06.03.24) #8 (07.26.24) -- IVE resistance - s/p IVV OS #1 (09.13.24 -- SAMPLE) , #2 (10.16.24), #3 (11.22.24), #4 (01.03.25) **history of increased IRF at 7 wks on 09.13.24 and 07.26.24 visits; at 6 wks on 01.03.25** - FA (06.02.23) shows focal BRVO inferior macula - originally, pt asymptomatic and didn't realize she had an issue until her routine eye exam - BCVA OS 20/20 -- stable - OCT shows Mild interval improvement in cystic changes IT macula at 5 weeks - recommend IVV OS #5 today, 2.07.25 with f/u at 5 weeks - pt wishes to proceed with IVV injection - RBA of procedure discussed, questions answered - IVV informed consent obtained and signed, OS (09.13.24) - see procedure note  - Vabysmo  auth obtained as of 10.18.24 - f/u 5 weeks, DFE, OCT, possible injxn  2,3. Hypertensive retinopathy OU - discussed importance of tight BP control - monitor   Ophthalmic Meds Ordered this visit:  Meds ordered this encounter  Medications   faricimab -svoa (VABYSMO ) 6mg /0.8mL intravitreal injection     Return in about 5 weeks (around 08/07/2023) for f/u BRVO OS, DFE, OCT.  There are no Patient Instructions on file for this visit.  This document serves as a record of services personally performed by Redell JUDITHANN Hans, MD, PhD. It was created on their behalf by Delon Newness COT, an ophthalmic technician. The creation of this record is the provider's dictation and/or activities during the visit.    Electronically signed by: Delon Newness COT 02.06.25 12:42 PM  This  document serves as a record of services personally performed by Redell JUDITHANN Hans, MD, PhD. It was created on their behalf by Alan PARAS. Delores, OA an ophthalmic technician. The creation of this record is the provider's dictation and/or activities during the visit.    Electronically signed by: Alan PARAS. Delores, OA 07/03/23 12:42 PM  Redell JUDITHANN Hans, M.D., Ph.D. Diseases & Surgery of the Retina and Vitreous [facility] 07/03/2023   I have reviewed the above documentation for accuracy and completeness, and I agree with the above. Redell JUDITHANN Hans, M.D., Ph.D. 07/03/23 12:42 PM  Abbreviations: M myopia (nearsighted); A astigmatism; H hyperopia (farsighted); P presbyopia; Mrx spectacle prescription;  CTL contact lenses; OD right eye; OS left eye; OU both eyes  XT exotropia; ET esotropia; PEK punctate epithelial keratitis; PEE punctate epithelial erosions; DES dry eye syndrome; MGD meibomian gland dysfunction; ATs artificial tears; PFAT's preservative free artificial tears; NSC nuclear sclerotic cataract; PSC posterior subcapsular cataract; ERM epi-retinal membrane; PVD posterior vitreous detachment; RD retinal detachment; DM diabetes mellitus; DR diabetic retinopathy; NPDR non-proliferative diabetic retinopathy; PDR proliferative diabetic retinopathy; CSME clinically significant macular edema; DME diabetic macular edema; dbh dot blot hemorrhages; CWS cotton wool spot; POAG primary open angle glaucoma; C/D cup-to-disc ratio; HVF humphrey visual field; GVF goldmann visual field; OCT optical coherence tomography; IOP intraocular pressure; BRVO Branch retinal vein occlusion; CRVO central retinal vein occlusion; CRAO central retinal artery occlusion; BRAO branch retinal artery occlusion; RT retinal tear; SB scleral buckle; PPV pars plana vitrectomy; VH Vitreous hemorrhage; PRP panretinal laser photocoagulation; IVK intravitreal kenalog; VMT vitreomacular traction; MH Macular hole;  NVD  neovascularization of the disc; NVE neovascularization elsewhere; AREDS age related eye disease study; ARMD age related macular degeneration; POAG primary open angle glaucoma; EBMD epithelial/anterior basement membrane dystrophy; ACIOL anterior chamber intraocular lens; IOL intraocular lens; PCIOL posterior chamber intraocular lens; Phaco/IOL phacoemulsification with intraocular lens placement; PRK photorefractive  keratectomy; LASIK laser assisted in situ keratomileusis; HTN hypertension; DM diabetes mellitus; COPD chronic obstructive pulmonary disease

## 2023-07-03 ENCOUNTER — Ambulatory Visit (INDEPENDENT_AMBULATORY_CARE_PROVIDER_SITE_OTHER): Payer: BC Managed Care – PPO | Admitting: Ophthalmology

## 2023-07-03 ENCOUNTER — Encounter (INDEPENDENT_AMBULATORY_CARE_PROVIDER_SITE_OTHER): Payer: Self-pay | Admitting: Ophthalmology

## 2023-07-03 DIAGNOSIS — I10 Essential (primary) hypertension: Secondary | ICD-10-CM | POA: Diagnosis not present

## 2023-07-03 DIAGNOSIS — H35033 Hypertensive retinopathy, bilateral: Secondary | ICD-10-CM | POA: Diagnosis not present

## 2023-07-03 DIAGNOSIS — H34832 Tributary (branch) retinal vein occlusion, left eye, with macular edema: Secondary | ICD-10-CM | POA: Diagnosis not present

## 2023-07-03 MED ORDER — FARICIMAB-SVOA 6 MG/0.05ML IZ SOSY
6.0000 mg | PREFILLED_SYRINGE | INTRAVITREAL | Status: AC | PRN
  Administered 2023-07-03: 6 mg via INTRAVITREAL

## 2023-07-29 NOTE — Progress Notes (Signed)
 [facility] - Clinic Note  08/07/2023     CHIEF COMPLAINT Patient presents for Retina Follow Up   HISTORY OF PRESENT ILLNESS: Sonya Anderson is a 58 y.o. female who presents to the clinic today for:   HPI     Retina Follow Up   Patient presents with  CRVO/BRVO.  In left eye.  This started 5 weeks ago.  I, the attending physician,  performed the HPI with the patient and updated documentation appropriately.        Comments   Patient feels the vision is the same. She is not using eye drops at this time.       Last edited by Rennis Chris, MD on 08/07/2023  6:38 PM.     Pt states vision is about the same  Referring physician: Smitty Cords, OD [address] [place], [place] [address]   HISTORICAL INFORMATION:   Selected notes from the MEDICAL RECORD NUMBER Referred by Dr. Daphine Deutscher for BRVO w/ CME OS LEE:  Ocular Hx- CME and BRVO OS PMH-    CURRENT MEDICATIONS: No current outpatient medications on file. (Ophthalmic Drugs)   No current facility-administered medications for this visit. (Ophthalmic Drugs)   Current Outpatient Medications (Other)  Medication Sig   omeprazole (PRILOSEC) 40 MG capsule Take by mouth.   No current facility-administered medications for this visit. (Other)   REVIEW OF SYSTEMS: ROS   Positive for: Eyes Negative for: Constitutional, Gastrointestinal, Neurological, Skin, Genitourinary, Musculoskeletal, HENT, Endocrine, Cardiovascular, Respiratory, Psychiatric, Allergic/Imm, Heme/Lymph Last edited by Charlette Caffey, COT on 08/07/2023  8:09 AM.      ALLERGIES No Known Allergies  PAST MEDICAL HISTORY History reviewed. No pertinent past medical history. Past Surgical History:  Procedure Laterality Date   CHOLECYSTECTOMY     FAMILY HISTORY History reviewed. No pertinent family history.  SOCIAL HISTORY Social History   Tobacco Use   Smoking status: Never   Smokeless tobacco: Never  Vaping Use   Vaping status:  Never Used  Substance Use Topics   Alcohol use: Never   Drug use: Never       OPHTHALMIC EXAM:  Base Eye Exam     Visual Acuity (Snellen - Linear)       Right Left   Dist cc 20/20 20/20 +2    Correction: Glasses         Tonometry (Tonopen, 8:10 AM)       Right Left   Pressure 17 14         Pupils       Dark Light Shape React APD   Right 3 2 Round Brisk None   Left 3 2 Round Brisk None         Visual Fields       Left Right    Full Full         Extraocular Movement       Right Left    Full, Ortho Full, Ortho         Neuro/Psych     Oriented x3: Yes   Mood/Affect: Normal         Dilation     Both eyes: 1.0% Mydriacyl, 2.5% Phenylephrine @ 8:09 AM           Slit Lamp and Fundus Exam     Slit Lamp Exam       Right Left   Lids/Lashes Dermatochalasis - upper lid Dermatochalasis - upper lid   Conjunctiva/Sclera White  and quiet White and quiet   Cornea Trace tear film debris Trace tear film debris   Anterior Chamber deep, clear, narrow temporal angle deep, clear, narrow temporal angle   Iris Round and dilated Round and dilated   Lens Clear Clear   Anterior Vitreous mild syneresis mild syneresis         Fundus Exam       Right Left   Disc Pink and Sharp, mild PPP Pink and Sharp, mild PPP   C/D Ratio 0.2 0.2   Macula Flat, Good foveal reflex, RPE mottling, focal drusen IT to fovea, No heme or edema Blunted foveal reflex, flame hemes and DBH IT macula -- stably improved, trace cystic changes IT macula -- persistent   Vessels attenuated, Tortuous attenuated, Tortuous -- IT BRVO   Periphery Attached Attached           IMAGING AND PROCEDURES  Imaging and Procedures for 08/07/2023  OCT, Retina - OU - Both Eyes       Right Eye Quality was good. Central Foveal Thickness: 266. Progression has been stable. Findings include normal foveal contour, no IRF, no SRF, retinal drusen , vitreomacular adhesion (Rare drusen).   Left  Eye Quality was good. Central Foveal Thickness: 247. Progression has been stable. Findings include normal foveal contour, no SRF, intraretinal fluid, vitreomacular adhesion (persistent cystic changes IT macula).   Notes *Images captured and stored on drive  Diagnosis / Impression:  OD: NFP, no IRF/SRF OS: BRVO w/ CME --  persistent cystic changes IT macula  Clinical management:  See below  Abbreviations: NFP - Normal foveal profile. CME - cystoid macular edema. PED - pigment epithelial detachment. IRF - intraretinal fluid. SRF - subretinal fluid. EZ - ellipsoid zone. ERM - epiretinal membrane. ORA - outer retinal atrophy. ORT - outer retinal tubulation. SRHM - subretinal hyper-reflective material. IRHM - intraretinal hyper-reflective material      Intravitreal Injection, Pharmacologic Agent - OS - Left Eye       Time Out 08/07/2023. 9:05 AM. Confirmed correct patient, procedure, site, and patient consented.   Anesthesia Topical anesthesia was used. Anesthetic medications included Lidocaine 2%, Proparacaine 0.5%.   Procedure Preparation included 5% betadine to ocular surface, eyelid speculum. A (32g) needle was used.   Injection: 6 mg faricimab-svoa 6 MG/0.05ML   Route: Intravitreal, Site: Left Eye   NDC: 16109-604-54, Lot: [id number], Expiration date: 09/22/2024, Waste: 0 mL   Post-op Post injection exam found visual acuity of at least counting fingers. The patient tolerated the procedure well. There were no complications. The patient received written and verbal post procedure care education. Post injection medications were not given.            ASSESSMENT/PLAN:    ICD-10-CM   1. Branch retinal vein occlusion of left eye with macular edema  H34.8320 OCT, Retina - OU - Both Eyes    Intravitreal Injection, Pharmacologic Agent - OS - Left Eye    faricimab-svoa (VABYSMO) 6mg /0.37mL intravitreal injection    2. Essential hypertension  I10     3. Hypertensive retinopathy of  both eyes  H35.033       BRVO w/ CME OS - s/p IVA OS #1 (06.07.23), #2 (07.06.23), #3 (08.04.23), #4 (09.01.23) -- IVA resistance =========================================================== - s/p IVE OS #1 (10.06.23), #2 (11.06.23), #3 (12.04.23), #4 (01.08.24), #5 (02.26.24), #6 (04.15.24), #7 (06.03.24) #8 (07.26.24) -- IVE resistance - s/p IVV OS #1 (09.13.24 -- SAMPLE) , #2 (10.16.24), #3 (11.22.24), #4 (01.03.25), #5 (02.07.25) **history  of increased IRF at 7 wks on 09.13.24 and 07.26.24 visits; at 6 wks on 01.03.25** - FA (06.02.23) shows focal BRVO inferior macula - originally, pt asymptomatic and didn't realize she had an issue until her routine eye exam - BCVA OS 20/20 -- stable - OCT shows persistent cystic changes IT macula at 5 weeks - recommend IVV OS #6 today, 03.14.25 with f/u at 5 weeks - pt wishes to proceed with IVV injection - RBA of procedure discussed, questions answered - IVV informed consent obtained and signed, OS (09.13.24) - see procedure note  - Vabysmo auth obtained as of 10.18.24 - f/u 5 weeks, DFE, OCT, possible injxn  2,3. Hypertensive retinopathy OU - discussed importance of tight BP control - monitor   Ophthalmic Meds Ordered this visit:  Meds ordered this encounter  Medications   faricimab-svoa (VABYSMO) 6mg /0.41mL intravitreal injection     Return in about 5 weeks (around 09/11/2023) for f/u BRVO OS, DFE, OCT, Possible Injxn.  There are no Patient Instructions on file for this visit.  This document serves as a record of services personally performed by Karie Chimera, MD, PhD. It was created on their behalf by Berlin Hun COT, an ophthalmic technician. The creation of this record is the provider's dictation and/or activities during the visit.    Electronically signed by: Berlin Hun COT 03.05.25  6:39 PM  This document serves as a record of services personally performed by Karie Chimera, MD, PhD. It was created on their  behalf by Glee Arvin. Manson Passey, OA an ophthalmic technician. The creation of this record is the provider's dictation and/or activities during the visit.    Electronically signed by: Glee Arvin. Manson Passey, OA 08/07/23 6:39 PM  Karie Chimera, M.D., Ph.D. Diseases & Surgery of the Retina and Vitreous [facility] 08/07/2023   I have reviewed the above documentation for accuracy and completeness, and I agree with the above. Karie Chimera, M.D., Ph.D. 08/07/23 6:39 PM    Abbreviations: M myopia (nearsighted); A astigmatism; H hyperopia (farsighted); P presbyopia; Mrx spectacle prescription;  CTL contact lenses; OD right eye; OS left eye; OU both eyes  XT exotropia; ET esotropia; PEK punctate epithelial keratitis; PEE punctate epithelial erosions; DES dry eye syndrome; MGD meibomian gland dysfunction; ATs artificial tears; PFAT's preservative free artificial tears; NSC nuclear sclerotic cataract; PSC posterior subcapsular cataract; ERM epi-retinal membrane; PVD posterior vitreous detachment; RD retinal detachment; DM diabetes mellitus; DR diabetic retinopathy; NPDR non-proliferative diabetic retinopathy; PDR proliferative diabetic retinopathy; CSME clinically significant macular edema; DME diabetic macular edema; dbh dot blot hemorrhages; CWS cotton wool spot; POAG primary open angle glaucoma; C/D cup-to-disc ratio; HVF humphrey visual field; GVF goldmann visual field; OCT optical coherence tomography; IOP intraocular pressure; BRVO Branch retinal vein occlusion; CRVO central retinal vein occlusion; CRAO central retinal artery occlusion; BRAO branch retinal artery occlusion; RT retinal tear; SB scleral buckle; PPV pars plana vitrectomy; VH Vitreous hemorrhage; PRP panretinal laser photocoagulation; IVK intravitreal kenalog; VMT vitreomacular traction; MH Macular hole;  NVD neovascularization of the disc; NVE neovascularization elsewhere; AREDS age related eye disease study; ARMD age related  macular degeneration; POAG primary open angle glaucoma; EBMD epithelial/anterior basement membrane dystrophy; ACIOL anterior chamber intraocular lens; IOL intraocular lens; PCIOL posterior chamber intraocular lens; Phaco/IOL phacoemulsification with intraocular lens placement; PRK photorefractive keratectomy; LASIK laser assisted in situ keratomileusis; HTN hypertension; DM diabetes mellitus; COPD chronic obstructive pulmonary disease

## 2023-08-07 ENCOUNTER — Encounter (INDEPENDENT_AMBULATORY_CARE_PROVIDER_SITE_OTHER): Payer: Self-pay | Admitting: Ophthalmology

## 2023-08-07 ENCOUNTER — Ambulatory Visit (INDEPENDENT_AMBULATORY_CARE_PROVIDER_SITE_OTHER): Payer: BC Managed Care – PPO | Admitting: Ophthalmology

## 2023-08-07 DIAGNOSIS — H34832 Tributary (branch) retinal vein occlusion, left eye, with macular edema: Secondary | ICD-10-CM

## 2023-08-07 DIAGNOSIS — I10 Essential (primary) hypertension: Secondary | ICD-10-CM

## 2023-08-07 DIAGNOSIS — H35033 Hypertensive retinopathy, bilateral: Secondary | ICD-10-CM

## 2023-08-07 MED ORDER — FARICIMAB-SVOA 6 MG/0.05ML IZ SOSY
6.0000 mg | PREFILLED_SYRINGE | INTRAVITREAL | Status: AC | PRN
Start: 2023-08-07 — End: 2023-08-07
  Administered 2023-08-07: 6 mg via INTRAVITREAL

## 2023-09-09 NOTE — Progress Notes (Signed)
 [facility] - Clinic Note  09/11/2023     CHIEF COMPLAINT Patient presents for Retina Follow Up   HISTORY OF PRESENT ILLNESS: Sonya Anderson is a 58 y.o. female who presents to the clinic today for:   HPI     Retina Follow Up   Patient presents with  CRVO/BRVO.  In left eye.  This started 5 weeks ago.  I, the attending physician,  performed the HPI with the patient and updated documentation appropriately.        Comments   Patient here for 5 weeks retina follow up for BRVO OS. Patient states vision doing good. No eye pain.      Last edited by Ronelle Coffee, MD on 09/11/2023 12:34 PM.    Pt states vision is stable  Referring physician: Augustin Bloch, OD [address] [place], [place] [address]   HISTORICAL INFORMATION:   Selected notes from the MEDICAL RECORD NUMBER Referred by Dr. Gaylyn Keas for BRVO w/ CME OS LEE:  Ocular Hx- CME and BRVO OS PMH-    CURRENT MEDICATIONS: No current outpatient medications on file. (Ophthalmic Drugs)   No current facility-administered medications for this visit. (Ophthalmic Drugs)   Current Outpatient Medications (Other)  Medication Sig   omeprazole (PRILOSEC) 40 MG capsule Take by mouth.   No current facility-administered medications for this visit. (Other)   REVIEW OF SYSTEMS: ROS   Positive for: Eyes Negative for: Constitutional, Gastrointestinal, Neurological, Skin, Genitourinary, Musculoskeletal, HENT, Endocrine, Cardiovascular, Respiratory, Psychiatric, Allergic/Imm, Heme/Lymph Last edited by Sylvan Evener, COA on 09/11/2023  7:59 AM.       ALLERGIES No Known Allergies  PAST MEDICAL HISTORY History reviewed. No pertinent past medical history. Past Surgical History:  Procedure Laterality Date   CHOLECYSTECTOMY     FAMILY HISTORY History reviewed. No pertinent family history.  SOCIAL HISTORY Social History   Tobacco Use   Smoking status: Never   Smokeless tobacco: Never  Vaping Use   Vaping  status: Never Used  Substance Use Topics   Alcohol use: Never   Drug use: Never       OPHTHALMIC EXAM:  Base Eye Exam     Visual Acuity (Snellen - Linear)       Right Left   Dist cc 20/20 20/20 -1    Correction: Glasses         Tonometry (Tonopen, 7:57 AM)       Right Left   Pressure 18 19         Pupils       Dark Light Shape React APD   Right 3 2 Round Brisk None   Left 3 2 Round Brisk None         Visual Fields (Counting fingers)       Left Right    Full Full         Extraocular Movement       Right Left    Full, Ortho Full, Ortho         Neuro/Psych     Oriented x3: Yes   Mood/Affect: Normal         Dilation     Both eyes: 1.0% Mydriacyl, 2.5% Phenylephrine @ 7:57 AM           Slit Lamp and Fundus Exam     Slit Lamp Exam       Right Left   Lids/Lashes Dermatochalasis - upper lid Dermatochalasis - upper lid   Conjunctiva/Sclera  White and quiet White and quiet   Cornea tear film debris Trace tear film debris   Anterior Chamber deep, clear, narrow temporal angle deep, clear, narrow temporal angle   Iris Round and dilated Round and dilated   Lens Clear Clear   Anterior Vitreous mild syneresis mild syneresis         Fundus Exam       Right Left   Disc Pink and Sharp, mild PPP Pink and Sharp, mild PPP   C/D Ratio 0.2 0.2   Macula Flat, Good foveal reflex, RPE mottling, focal drusen IT to fovea, No heme or edema Blunted foveal reflex, flame hemes and DBH IT macula -- stably improved, trace cystic changes IT macula, punctate exudates IT mac   Vessels attenuated attenuated, Tortuous -- IT BRVO   Periphery Attached Attached           Refraction     Wearing Rx       Sphere Cylinder Axis   Right +2.75 +0.25 117   Left +3.25 +0.25 060           IMAGING AND PROCEDURES  Imaging and Procedures for 09/11/2023  Intravitreal Injection, Pharmacologic Agent - OS - Left Eye       Time Out 09/11/2023. 8:44 AM.  Confirmed correct patient, procedure, site, and patient consented.   Anesthesia Topical anesthesia was used. Anesthetic medications included Lidocaine 2%, Proparacaine 0.5%.   Procedure Preparation included 5% betadine to ocular surface, eyelid speculum. A supplied (32g) needle was used.   Injection: 6 mg faricimab -svoa 6 MG/0.05ML   Route: Intravitreal, Site: Left Eye   NDC: 28413-244-01, Lot: [id number], Expiration date: 09/22/2024, Waste: 0 mL   Post-op Post injection exam found visual acuity of at least counting fingers. The patient tolerated the procedure well. There were no complications. The patient received written and verbal post procedure care education. Post injection medications were not given.             ASSESSMENT/PLAN:    ICD-10-CM   1. Branch retinal vein occlusion of left eye with macular edema  H34.8320 Intravitreal Injection, Pharmacologic Agent - OS - Left Eye    faricimab -svoa (VABYSMO ) 6mg /0.20mL intravitreal injection    2. Essential hypertension  I10     3. Hypertensive retinopathy of both eyes  H35.033        BRVO w/ CME OS - s/p IVA OS #1 (06.07.23), #2 (07.06.23), #3 (08.04.23), #4 (09.01.23) -- IVA resistance =========================================================== - s/p IVE OS #1 (10.06.23), #2 (11.06.23), #3 (12.04.23), #4 (01.08.24), #5 (02.26.24), #6 (04.15.24), #7 (06.03.24) #8 (07.26.24) -- IVE resistance - s/p IVV OS #1 (09.13.24 -- SAMPLE) , #2 (10.16.24), #3 (11.22.24), #4 (01.03.25), #5 (02.07.25), #6 (03.14.25) **history of increased IRF at 7 wks on 09.13.24 and 07.26.24 visits; at 6 wks on 01.03.25** - FA (06.02.23) shows focal BRVO inferior macula - originally, pt asymptomatic and didn't realize she had an issue until her routine eye exam - BCVA OS 20/20 -- stable - no OCT obtained today -- cameras not working - recommend IVV OS #7 today, 04.18.25 with f/u at 5 weeks - pt wishes to proceed with IVV injection - RBA of procedure  discussed, questions answered - IVV informed consent obtained and signed, OS (09.13.24) - see procedure note  - Vabysmo  auth obtained as of 10.18.24 - f/u 5 weeks, DFE, OCT, possible injxn  2,3. Hypertensive retinopathy OU - discussed importance of tight BP control - monitor   Ophthalmic Meds Ordered this visit:  Meds ordered this encounter  Medications   faricimab -svoa (VABYSMO ) 6mg /0.77mL intravitreal injection     Return in about 5 weeks (around 10/16/2023) for f/u BRVO OS, DFE, OCT, Possible Injxn.  There are no Patient Instructions on file for this visit.  This document serves as a record of services personally performed by Jeanice Millard, MD, PhD. It was created on their behalf by Eller Gut COT, an ophthalmic technician. The creation of this record is the provider's dictation and/or activities during the visit.    Electronically signed by: Eller Gut COT 04.16.2025 12:36 PM  This document serves as a record of services personally performed by Jeanice Millard, MD, PhD. It was created on their behalf by Morley Arabia. Bevin Bucks, OA an ophthalmic technician. The creation of this record is the provider's dictation and/or activities during the visit.    Electronically signed by: Morley Arabia. Antionette Kirks 09/11/23 12:36 PM   Jeanice Millard, M.D., Ph.D. Diseases & Surgery of the Retina and Vitreous [facility] 09/11/2023   I have reviewed the above documentation for accuracy and completeness, and I agree with the above. Jeanice Millard, M.D., Ph.D. 09/11/23 12:36 PM    Abbreviations: M myopia (nearsighted); A astigmatism; H hyperopia (farsighted); P presbyopia; Mrx spectacle prescription;  CTL contact lenses; OD right eye; OS left eye; OU both eyes  XT exotropia; ET esotropia; PEK punctate epithelial keratitis; PEE punctate epithelial erosions; DES dry eye syndrome; MGD meibomian gland dysfunction; ATs artificial tears; PFAT's preservative free  artificial tears; NSC nuclear sclerotic cataract; PSC posterior subcapsular cataract; ERM epi-retinal membrane; PVD posterior vitreous detachment; RD retinal detachment; DM diabetes mellitus; DR diabetic retinopathy; NPDR non-proliferative diabetic retinopathy; PDR proliferative diabetic retinopathy; CSME clinically significant macular edema; DME diabetic macular edema; dbh dot blot hemorrhages; CWS cotton wool spot; POAG primary open angle glaucoma; C/D cup-to-disc ratio; HVF humphrey visual field; GVF goldmann visual field; OCT optical coherence tomography; IOP intraocular pressure; BRVO Branch retinal vein occlusion; CRVO central retinal vein occlusion; CRAO central retinal artery occlusion; BRAO branch retinal artery occlusion; RT retinal tear; SB scleral buckle; PPV pars plana vitrectomy; VH Vitreous hemorrhage; PRP panretinal laser photocoagulation; IVK intravitreal kenalog; VMT vitreomacular traction; MH Macular hole;  NVD neovascularization of the disc; NVE neovascularization elsewhere; AREDS age related eye disease study; ARMD age related macular degeneration; POAG primary open angle glaucoma; EBMD epithelial/anterior basement membrane dystrophy; ACIOL anterior chamber intraocular lens; IOL intraocular lens; PCIOL posterior chamber intraocular lens; Phaco/IOL phacoemulsification with intraocular lens placement; PRK photorefractive keratectomy; LASIK laser assisted in situ keratomileusis; HTN hypertension; DM diabetes mellitus; COPD chronic obstructive pulmonary disease

## 2023-09-11 ENCOUNTER — Ambulatory Visit (INDEPENDENT_AMBULATORY_CARE_PROVIDER_SITE_OTHER): Admitting: Ophthalmology

## 2023-09-11 ENCOUNTER — Encounter (INDEPENDENT_AMBULATORY_CARE_PROVIDER_SITE_OTHER): Payer: Self-pay | Admitting: Ophthalmology

## 2023-09-11 DIAGNOSIS — H35033 Hypertensive retinopathy, bilateral: Secondary | ICD-10-CM | POA: Diagnosis not present

## 2023-09-11 DIAGNOSIS — I10 Essential (primary) hypertension: Secondary | ICD-10-CM

## 2023-09-11 DIAGNOSIS — H34832 Tributary (branch) retinal vein occlusion, left eye, with macular edema: Secondary | ICD-10-CM | POA: Diagnosis not present

## 2023-09-11 MED ORDER — FARICIMAB-SVOA 6 MG/0.05ML IZ SOSY
6.0000 mg | PREFILLED_SYRINGE | INTRAVITREAL | Status: AC | PRN
Start: 2023-09-11 — End: 2023-09-11
  Administered 2023-09-11: 6 mg via INTRAVITREAL

## 2023-10-14 NOTE — Progress Notes (Signed)
 [facility] - Clinic Note  10/16/2023     CHIEF COMPLAINT Patient presents for Retina Follow Up   HISTORY OF PRESENT ILLNESS: Sonya Anderson is a 58 y.o. female who presents to the clinic today for:   HPI     Retina Follow Up   Patient presents with  CRVO/BRVO.  In left eye.  This started 5 weeks ago.  I, the attending physician,  performed the HPI with the patient and updated documentation appropriately.        Comments   Patient feels the vision is the same. She is not using eye drops.       Last edited by Ronelle Coffee, MD on 10/16/2023 12:34 PM.     Pt states vision is stable  Referring physician: Augustin Bloch, OD [address] [place], [place] [address]  HISTORICAL INFORMATION:   Selected notes from the MEDICAL RECORD NUMBER Referred by Dr. Gaylyn Keas for BRVO w/ CME OS LEE:  Ocular Hx- CME and BRVO OS PMH-    CURRENT MEDICATIONS: No current outpatient medications on file. (Ophthalmic Drugs)   No current facility-administered medications for this visit. (Ophthalmic Drugs)   Current Outpatient Medications (Other)  Medication Sig   omeprazole (PRILOSEC) 40 MG capsule Take by mouth.   No current facility-administered medications for this visit. (Other)   REVIEW OF SYSTEMS: ROS   Positive for: Eyes Negative for: Constitutional, Gastrointestinal, Neurological, Skin, Genitourinary, Musculoskeletal, HENT, Endocrine, Cardiovascular, Respiratory, Psychiatric, Allergic/Imm, Heme/Lymph Last edited by Olene Berne, COT on 10/16/2023  7:52 AM.        ALLERGIES No Known Allergies  PAST MEDICAL HISTORY History reviewed. No pertinent past medical history. Past Surgical History:  Procedure Laterality Date   CHOLECYSTECTOMY     FAMILY HISTORY History reviewed. No pertinent family history.  SOCIAL HISTORY Social History   Tobacco Use   Smoking status: Never   Smokeless tobacco: Never  Vaping Use   Vaping status: Never Used  Substance  Use Topics   Alcohol use: Never   Drug use: Never       OPHTHALMIC EXAM:  Base Eye Exam     Visual Acuity (Snellen - Linear)       Right Left   Dist cc 20/20 20/20    Correction: Glasses         Tonometry (Tonopen, 7:54 AM)       Right Left   Pressure 13 11         Pupils       Dark Light Shape React APD   Right 3 2 Round Brisk None   Left 3 2 Round Brisk None         Visual Fields       Left Right    Full Full         Extraocular Movement       Right Left    Full, Ortho Full, Ortho         Neuro/Psych     Oriented x3: Yes   Mood/Affect: Normal         Dilation     Both eyes: 1.0% Mydriacyl, 2.5% Phenylephrine @ 7:53 AM           Slit Lamp and Fundus Exam     Slit Lamp Exam       Right Left   Lids/Lashes Dermatochalasis - upper lid Dermatochalasis - upper lid   Conjunctiva/Sclera White and quiet White and quiet  Cornea tear film debris Trace tear film debris   Anterior Chamber deep, clear, narrow temporal angle deep, clear, narrow temporal angle   Iris Round and dilated Round and dilated   Lens Clear Clear   Anterior Vitreous mild syneresis mild syneresis         Fundus Exam       Right Left   Disc Pink and Sharp, mild PPP Pink and Sharp, mild PPP   C/D Ratio 0.2 0.2   Macula Flat, Good foveal reflex, RPE mottling, focal drusen IT to fovea, No heme or edema Blunted foveal reflex, flame hemes and DBH IT macula -- stably improved, trace cystic changes IT macula -- slightly improved, punctate exudates IT mac -- improved   Vessels attenuated, mild tortuosity attenuated, Tortuous -- IT BRVO   Periphery Attached Attached           Refraction     Wearing Rx       Sphere Cylinder Axis   Right +2.75 +0.25 117   Left +3.25 +0.25 060           IMAGING AND PROCEDURES  Imaging and Procedures for 10/16/2023  OCT, Retina - OU - Both Eyes        Right Eye Quality was good. Central Foveal Thickness: 268.  Progression has been stable. Findings include normal foveal contour, no IRF, no SRF, retinal drusen , vitreomacular adhesion (Rare drusen).   Left Eye Quality was good. Central Foveal Thickness: 249. Progression has improved. Findings include normal foveal contour, no SRF, intraretinal fluid, vitreomacular adhesion (persistent cystic changes IT macula -- slightly improved).   Notes  *Images captured and stored on drive  Diagnosis / Impression:  OD: NFP, no IRF/SRF OS: BRVO w/ CME --  persistent cystic changes IT macula -- slightly improved  Clinical management:  See below  Abbreviations: NFP - Normal foveal profile. CME - cystoid macular edema. PED - pigment epithelial detachment. IRF - intraretinal fluid. SRF - subretinal fluid. EZ - ellipsoid zone. ERM - epiretinal membrane. ORA - outer retinal atrophy. ORT - outer retinal tubulation. SRHM - subretinal hyper-reflective material. IRHM - intraretinal hyper-reflective material      Intravitreal Injection, Pharmacologic Agent - OS - Left Eye       Time Out 10/16/2023. 9:17 AM. Confirmed correct patient, procedure, site, and patient consented.   Anesthesia Topical anesthesia was used. Anesthetic medications included Lidocaine 2%, Proparacaine 0.5%.   Procedure Preparation included 5% betadine to ocular surface, eyelid speculum. A supplied (32g) needle was used.   Injection: 6 mg faricimab -svoa 6 MG/0.05ML   Route: Intravitreal, Site: Left Eye   NDC: 44010-272-53, Lot: [id number], Expiration date: 09/22/2024, Waste: 0 mL   Post-op Post injection exam found visual acuity of at least counting fingers. The patient tolerated the procedure well. There were no complications. The patient received written and verbal post procedure care education. Post injection medications were not given.            ASSESSMENT/PLAN:    ICD-10-CM   1. Branch retinal vein occlusion of left eye with macular edema  H34.8320 OCT, Retina - OU - Both Eyes     Intravitreal Injection, Pharmacologic Agent - OS - Left Eye    faricimab -svoa (VABYSMO ) 6mg /0.51mL intravitreal injection    2. Essential hypertension  I10     3. Hypertensive retinopathy of both eyes  H35.033      BRVO w/ CME OS - s/p IVA OS #1 (06.07.23), #2 (07.06.23), #3 (08.04.23), #4 (09.01.23) --  IVA resistance =========================================================== - s/p IVE OS #1 (10.06.23), #2 (11.06.23), #3 (12.04.23), #4 (01.08.24), #5 (02.26.24), #6 (04.15.24), #7 (06.03.24) #8 (07.26.24) -- IVE resistance - s/p IVV OS #1 (09.13.24 -- SAMPLE) , #2 (10.16.24), #3 (11.22.24), #4 (01.03.25), #5 (02.07.25), #6 (03.14.25), #7 (04.18.25) **history of increased IRF at 7 wks on 09.13.24 and 07.26.24 visits; at 6 wks on 01.03.25** - FA (06.02.23) shows focal BRVO inferior macula - originally, pt asymptomatic and didn't realize she had an issue until her routine eye exam - BCVA OS 20/20 -- stable - OCT shows persistent cystic changes IT macula -- slightly improved at 5 wks - recommend IVV OS #8 today, 05.23.25 with f/u at 5 weeks - pt wishes to proceed with IVV injection - RBA of procedure discussed, questions answered - IVV informed consent obtained and signed, OS (09.13.24) - see procedure note  - Vabysmo  auth obtained as of 10.18.24 - f/u 5 weeks, DFE, OCT, possible injxn  2,3. Hypertensive retinopathy OU - discussed importance of tight BP control - monitor   Ophthalmic Meds Ordered this visit:  Meds ordered this encounter  Medications   faricimab -svoa (VABYSMO ) 6mg /0.29mL intravitreal injection     Return in about 5 weeks (around 11/20/2023) for f/u BRVO OS, DFE, OCT, Possible Injxn.  There are no Patient Instructions on file for this visit.  This document serves as a record of services personally performed by Jeanice Millard, MD, PhD. It was created on their behalf by Eller Gut COT, an ophthalmic technician. The creation of this record is the  provider's dictation and/or activities during the visit.    Electronically signed by: Eller Gut COT 05.21.25 12:35 PM  This document serves as a record of services personally performed by Jeanice Millard, MD, PhD. It was created on their behalf by Morley Arabia. Bevin Bucks, OA an ophthalmic technician. The creation of this record is the provider's dictation and/or activities during the visit.    Electronically signed by: Morley Arabia. Antionette Kirks 10/16/23 12:35 PM  Jeanice Millard, M.D., Ph.D. Diseases & Surgery of the Retina and Vitreous [facility] 10/16/2023   I have reviewed the above documentation for accuracy and completeness, and I agree with the above. Jeanice Millard, M.D., Ph.D. 10/16/23 12:36 PM   Abbreviations: M myopia (nearsighted); A astigmatism; H hyperopia (farsighted); P presbyopia; Mrx spectacle prescription;  CTL contact lenses; OD right eye; OS left eye; OU both eyes  XT exotropia; ET esotropia; PEK punctate epithelial keratitis; PEE punctate epithelial erosions; DES dry eye syndrome; MGD meibomian gland dysfunction; ATs artificial tears; PFAT's preservative free artificial tears; NSC nuclear sclerotic cataract; PSC posterior subcapsular cataract; ERM epi-retinal membrane; PVD posterior vitreous detachment; RD retinal detachment; DM diabetes mellitus; DR diabetic retinopathy; NPDR non-proliferative diabetic retinopathy; PDR proliferative diabetic retinopathy; CSME clinically significant macular edema; DME diabetic macular edema; dbh dot blot hemorrhages; CWS cotton wool spot; POAG primary open angle glaucoma; C/D cup-to-disc ratio; HVF humphrey visual field; GVF goldmann visual field; OCT optical coherence tomography; IOP intraocular pressure; BRVO Branch retinal vein occlusion; CRVO central retinal vein occlusion; CRAO central retinal artery occlusion; BRAO branch retinal artery occlusion; RT retinal tear; SB scleral buckle; PPV pars plana vitrectomy; VH  Vitreous hemorrhage; PRP panretinal laser photocoagulation; IVK intravitreal kenalog; VMT vitreomacular traction; MH Macular hole;  NVD neovascularization of the disc; NVE neovascularization elsewhere; AREDS age related eye disease study; ARMD age related macular degeneration; POAG primary open angle glaucoma; EBMD epithelial/anterior basement membrane dystrophy; ACIOL anterior  chamber intraocular lens; IOL intraocular lens; PCIOL posterior chamber intraocular lens; Phaco/IOL phacoemulsification with intraocular lens placement; PRK photorefractive keratectomy; LASIK laser assisted in situ keratomileusis; HTN hypertension; DM diabetes mellitus; COPD chronic obstructive pulmonary disease

## 2023-10-16 ENCOUNTER — Ambulatory Visit (INDEPENDENT_AMBULATORY_CARE_PROVIDER_SITE_OTHER): Admitting: Ophthalmology

## 2023-10-16 ENCOUNTER — Encounter (INDEPENDENT_AMBULATORY_CARE_PROVIDER_SITE_OTHER): Payer: Self-pay | Admitting: Ophthalmology

## 2023-10-16 DIAGNOSIS — I10 Essential (primary) hypertension: Secondary | ICD-10-CM

## 2023-10-16 DIAGNOSIS — H34832 Tributary (branch) retinal vein occlusion, left eye, with macular edema: Secondary | ICD-10-CM | POA: Diagnosis not present

## 2023-10-16 DIAGNOSIS — H35033 Hypertensive retinopathy, bilateral: Secondary | ICD-10-CM | POA: Diagnosis not present

## 2023-10-16 MED ORDER — FARICIMAB-SVOA 6 MG/0.05ML IZ SOSY
6.0000 mg | PREFILLED_SYRINGE | INTRAVITREAL | Status: AC | PRN
  Administered 2023-10-16: 6 mg via INTRAVITREAL

## 2023-11-19 NOTE — Progress Notes (Signed)
 [facility] - Clinic Note  11/20/2023     CHIEF COMPLAINT Patient presents for Retina Follow Up   HISTORY OF PRESENT ILLNESS: Sonya Anderson is a 58 y.o. female who presents to the clinic today for:   HPI     Retina Follow Up   Patient presents with  CRVO/BRVO.  In left eye.  This started 5 weeks ago.  I, the attending physician,  performed the HPI with the patient and updated documentation appropriately.        Comments   Patient here for 5 weeks retina follow up for BRVO OS. Patient states vision doing ok. No eye pain.      Last edited by Valdemar Rogue, MD on 11/20/2023  9:17 AM.      Pt states vision is stable  Referring physician: Celestine Lunger, OD [address] [place], [place] [address]  HISTORICAL INFORMATION:   Selected notes from the MEDICAL RECORD NUMBER Referred by Dr. Lunger for BRVO w/ CME OS LEE:  Ocular Hx- CME and BRVO OS PMH-    CURRENT MEDICATIONS: No current outpatient medications on file. (Ophthalmic Drugs)   No current facility-administered medications for this visit. (Ophthalmic Drugs)   Current Outpatient Medications (Other)  Medication Sig   omeprazole (PRILOSEC) 40 MG capsule Take by mouth.   No current facility-administered medications for this visit. (Other)   REVIEW OF SYSTEMS: ROS   Positive for: Eyes Negative for: Constitutional, Gastrointestinal, Neurological, Skin, Genitourinary, Musculoskeletal, HENT, Endocrine, Cardiovascular, Respiratory, Psychiatric, Allergic/Imm, Heme/Lymph Last edited by Orval Asberry RAMAN, COA on 11/20/2023  8:17 AM.      ALLERGIES No Known Allergies  PAST MEDICAL HISTORY History reviewed. No pertinent past medical history. Past Surgical History:  Procedure Laterality Date   CHOLECYSTECTOMY     FAMILY HISTORY History reviewed. No pertinent family history.  SOCIAL HISTORY Social History   Tobacco Use   Smoking status: Never   Smokeless tobacco: Never  Vaping Use   Vaping  status: Never Used  Substance Use Topics   Alcohol use: Never   Drug use: Never       OPHTHALMIC EXAM:  Base Eye Exam     Visual Acuity (Snellen - Linear)       Right Left   Dist cc 20/20 20/20    Correction: Glasses         Tonometry (Tonopen, 8:16 AM)       Right Left   Pressure 15 15         Pupils       Dark Light Shape React APD   Right 3 2 Round Brisk None   Left 3 2 Round Brisk None         Visual Fields (Counting fingers)       Left Right    Full Full         Extraocular Movement       Right Left    Full, Ortho Full, Ortho         Neuro/Psych     Oriented x3: Yes   Mood/Affect: Normal         Dilation     Both eyes: 1.0% Mydriacyl, 2.5% Phenylephrine @ 8:16 AM           Slit Lamp and Fundus Exam     Slit Lamp Exam       Right Left   Lids/Lashes Dermatochalasis - upper lid Dermatochalasis - upper lid   Conjunctiva/Sclera  White and quiet White and quiet   Cornea tear film debris Trace tear film debris   Anterior Chamber deep, clear, narrow temporal angle deep, clear, narrow temporal angle   Iris Round and dilated Round and dilated   Lens Clear Clear   Anterior Vitreous mild syneresis mild syneresis         Fundus Exam       Right Left   Disc Pink and Sharp, mild PPP Pink and Sharp, mild PPP   C/D Ratio 0.2 0.2   Macula Flat, Good foveal reflex, RPE mottling, focal drusen IT to fovea, No heme or edema Blunted foveal reflex, flame hemes and DBH IT macula -- stably improved, trace cystic changes IT macula -- persistent, no exudates   Vessels attenuated, mild tortuosity attenuated, Tortuous -- IT BRVO   Periphery Attached Attached           IMAGING AND PROCEDURES  Imaging and Procedures for 11/20/2023  OCT, Retina - OU - Both Eyes       Right Eye Quality was good. Central Foveal Thickness: 259. Progression has been stable. Findings include normal foveal contour, no IRF, no SRF, retinal drusen , vitreomacular  adhesion (Rare drusen).   Left Eye Quality was good. Central Foveal Thickness: 250. Progression has been stable. Findings include normal foveal contour, no SRF, intraretinal fluid, vitreomacular adhesion (persistent cystic changes IT macula ).   Notes *Images captured and stored on drive  Diagnosis / Impression:  OD: NFP, no IRF/SRF OS: BRVO w/ CME --  persistent cystic changes IT macula  Clinical management:  See below  Abbreviations: NFP - Normal foveal profile. CME - cystoid macular edema. PED - pigment epithelial detachment. IRF - intraretinal fluid. SRF - subretinal fluid. EZ - ellipsoid zone. ERM - epiretinal membrane. ORA - outer retinal atrophy. ORT - outer retinal tubulation. SRHM - subretinal hyper-reflective material. IRHM - intraretinal hyper-reflective material      Intravitreal Injection, Pharmacologic Agent - OS - Left Eye       Time Out 11/20/2023. 9:05 AM. Confirmed correct patient, procedure, site, and patient consented.   Anesthesia Topical anesthesia was used. Anesthetic medications included Lidocaine 2%, Proparacaine 0.5%.   Procedure Preparation included 5% betadine to ocular surface, eyelid speculum. A supplied (32g) needle was used.   Injection: 6 mg faricimab -svoa 6 MG/0.05ML   Route: Intravitreal, Site: Left Eye   NDC: 49757-903-93, Lot: [id number], Expiration date: 10/23/2024, Waste: 0 mL   Post-op Post injection exam found visual acuity of at least counting fingers. The patient tolerated the procedure well. There were no complications. The patient received written and verbal post procedure care education. Post injection medications were not given.            ASSESSMENT/PLAN:    ICD-10-CM   1. Branch retinal vein occlusion of left eye with macular edema  H34.8320 OCT, Retina - OU - Both Eyes    Intravitreal Injection, Pharmacologic Agent - OS - Left Eye    faricimab -svoa (VABYSMO ) 6mg /0.35mL intravitreal injection    2. Essential hypertension   I10     3. Hypertensive retinopathy of both eyes  H35.033      BRVO w/ CME OS - s/p IVA OS #1 (06.07.23), #2 (07.06.23), #3 (08.04.23), #4 (09.01.23) -- IVA resistance ======================= - s/p IVE OS #1 (10.06.23), #2 (11.06.23), #3 (12.04.23), #4 (01.08.24), #5 (02.26.24), #6 (04.15.24), #7 (06.03.24) #8 (07.26.24) -- IVE resistance ======================= - s/p IVV OS #1 (09.13.24 -- SAMPLE) , #2 (10.16.24), #3 (11.22.24), #  4 (01.03.25), #5 (02.07.25), #6 (03.14.25), #7 (04.18.25), #8 (05.23.25) ======================= **history of increased IRF at 7 wks on 09.13.24 and 07.26.24 visits; at 6 wks on 01.03.25** - FA (06.02.23) shows focal BRVO inferior macula - originally, pt asymptomatic and didn't realize she had an issue until her routine eye exam - BCVA OS 20/20 -- stable - OCT shows persistent cystic changes IT macula at 5 wks - recommend IVV OS #9 today, 06.27.25 with f/u at 5 weeks - pt wishes to proceed with IVV injection - RBA of procedure discussed, questions answered - IVV informed consent obtained and signed, OS (09.13.24) - see procedure note  - Vabysmo  auth obtained as of 10.18.24 - f/u 5 weeks, DFE, OCT, possible injxn  2,3. Hypertensive retinopathy OU - discussed importance of tight BP control - monitor   Ophthalmic Meds Ordered this visit:  Meds ordered this encounter  Medications   faricimab -svoa (VABYSMO ) 6mg /0.1mL intravitreal injection     Return in about 5 weeks (around 12/25/2023) for f/u BRVO OS, DFE, OCT, Possible Injxn.  There are no Patient Instructions on file for this visit.  This document serves as a record of services personally performed by Redell JUDITHANN Hans, MD, PhD. It was created on their behalf by Delon Newness COT, an ophthalmic technician. The creation of this record is the provider's dictation and/or activities during the visit.    Electronically signed by: Delon Newness COT 06.26.25 3:12 PM  This document serves as a  record of services personally performed by Redell JUDITHANN Hans, MD, PhD. It was created on their behalf by Alan PARAS. Delores, OA an ophthalmic technician. The creation of this record is the provider's dictation and/or activities during the visit.    Electronically signed by: Alan PARAS. Delores, OA 11/22/23 3:12 PM  Redell JUDITHANN Hans, M.D., Ph.D. Diseases & Surgery of the Retina and Vitreous [facility] 11/20/2023   I have reviewed the above documentation for accuracy and completeness, and I agree with the above. Redell JUDITHANN Hans, M.D., Ph.D. 11/22/23 3:14 PM   Abbreviations: M myopia (nearsighted); A astigmatism; H hyperopia (farsighted); P presbyopia; Mrx spectacle prescription;  CTL contact lenses; OD right eye; OS left eye; OU both eyes  XT exotropia; ET esotropia; PEK punctate epithelial keratitis; PEE punctate epithelial erosions; DES dry eye syndrome; MGD meibomian gland dysfunction; ATs artificial tears; PFAT's preservative free artificial tears; NSC nuclear sclerotic cataract; PSC posterior subcapsular cataract; ERM epi-retinal membrane; PVD posterior vitreous detachment; RD retinal detachment; DM diabetes mellitus; DR diabetic retinopathy; NPDR non-proliferative diabetic retinopathy; PDR proliferative diabetic retinopathy; CSME clinically significant macular edema; DME diabetic macular edema; dbh dot blot hemorrhages; CWS cotton wool spot; POAG primary open angle glaucoma; C/D cup-to-disc ratio; HVF humphrey visual field; GVF goldmann visual field; OCT optical coherence tomography; IOP intraocular pressure; BRVO Branch retinal vein occlusion; CRVO central retinal vein occlusion; CRAO central retinal artery occlusion; BRAO branch retinal artery occlusion; RT retinal tear; SB scleral buckle; PPV pars plana vitrectomy; VH Vitreous hemorrhage; PRP panretinal laser photocoagulation; IVK intravitreal kenalog; VMT vitreomacular traction; MH Macular hole;  NVD neovascularization of the disc;  NVE neovascularization elsewhere; AREDS age related eye disease study; ARMD age related macular degeneration; POAG primary open angle glaucoma; EBMD epithelial/anterior basement membrane dystrophy; ACIOL anterior chamber intraocular lens; IOL intraocular lens; PCIOL posterior chamber intraocular lens; Phaco/IOL phacoemulsification with intraocular lens placement; PRK photorefractive keratectomy; LASIK laser assisted in situ keratomileusis; HTN hypertension; DM diabetes mellitus; COPD chronic obstructive pulmonary disease

## 2023-11-20 ENCOUNTER — Encounter (INDEPENDENT_AMBULATORY_CARE_PROVIDER_SITE_OTHER): Payer: Self-pay | Admitting: Ophthalmology

## 2023-11-20 ENCOUNTER — Ambulatory Visit (INDEPENDENT_AMBULATORY_CARE_PROVIDER_SITE_OTHER): Admitting: Ophthalmology

## 2023-11-20 DIAGNOSIS — I10 Essential (primary) hypertension: Secondary | ICD-10-CM

## 2023-11-20 DIAGNOSIS — H34832 Tributary (branch) retinal vein occlusion, left eye, with macular edema: Secondary | ICD-10-CM

## 2023-11-20 DIAGNOSIS — H35033 Hypertensive retinopathy, bilateral: Secondary | ICD-10-CM

## 2023-11-20 MED ORDER — FARICIMAB-SVOA 6 MG/0.05ML IZ SOSY
6.0000 mg | PREFILLED_SYRINGE | INTRAVITREAL | Status: AC | PRN
  Administered 2023-11-20: 6 mg via INTRAVITREAL

## 2023-12-21 NOTE — Progress Notes (Signed)
 [facility] - Clinic Note  12/25/2023     CHIEF COMPLAINT Patient presents for Retina Follow Up   HISTORY OF PRESENT ILLNESS: Sonya Anderson is a 58 y.o. female who presents to the clinic today for:   HPI     Retina Follow Up   Patient presents with  CRVO/BRVO.  In left eye.  This started 2 years ago.  Severity is moderate.  Duration of 5 weeks.  Since onset it is stable.  I, the attending physician,  performed the HPI with the patient and updated documentation appropriately.        Comments   Pt denies changes in vision, FOL, floaters, pain. Pt is not using ats.      Last edited by Kenley Rettinger, MD on 12/30/2023 10:47 PM.    Pt states vision is stable, she went to [facility] to get a new contact lens Rx  Referring physician: Celestine Lunger, OD [address]  [place], [place] [address]  HISTORICAL INFORMATION:   Selected notes from the MEDICAL RECORD NUMBER Referred by Dr. Lunger for BRVO w/ CME OS LEE:  Ocular Hx- CME and BRVO OS PMH-    CURRENT MEDICATIONS: No current outpatient medications on file. (Ophthalmic Drugs)   No current facility-administered medications for this visit. (Ophthalmic Drugs)   Current Outpatient Medications (Other)  Medication Sig   omeprazole (PRILOSEC) 40 MG capsule Take by mouth.   No current facility-administered medications for this visit. (Other)   REVIEW OF SYSTEMS: ROS   Positive for: Eyes Negative for: Constitutional, Gastrointestinal, Neurological, Skin, Genitourinary, Musculoskeletal, HENT, Endocrine, Cardiovascular, Respiratory, Psychiatric, Allergic/Imm, Heme/Lymph Last edited by Elnor Avelina RAMAN, COT on 12/25/2023  7:57 AM.       ALLERGIES No Known Allergies  PAST MEDICAL HISTORY History reviewed. No pertinent past medical history. Past Surgical History:  Procedure Laterality Date   CHOLECYSTECTOMY     FAMILY HISTORY History reviewed. No pertinent family history.  SOCIAL HISTORY Social History    Tobacco Use   Smoking status: Never   Smokeless tobacco: Never  Vaping Use   Vaping status: Never Used  Substance Use Topics   Alcohol use: Never   Drug use: Never       OPHTHALMIC EXAM:  Base Eye Exam     Visual Acuity (Snellen - Linear)       Right Left   Dist cc 20/20 20/20 -1    Correction: Glasses         Tonometry (Tonopen, 8:00 AM)       Right Left   Pressure 13 12         Pupils       Pupils Dark Light Shape React APD   Right PERRL 3 2 Round Brisk None   Left PERRL 3 2 Round Brisk None         Visual Fields       Left Right    Full Full         Extraocular Movement       Right Left    Full, Ortho Full, Ortho         Neuro/Psych     Oriented x3: Yes   Mood/Affect: Normal         Dilation     Both eyes: 1.0% Mydriacyl, 2.5% Phenylephrine @ 8:00 AM           Slit Lamp and Fundus Exam     Slit Lamp Exam  Right Left   Lids/Lashes Dermatochalasis - upper lid Dermatochalasis - upper lid   Conjunctiva/Sclera White and quiet White and quiet   Cornea tear film debris Trace tear film debris   Anterior Chamber deep, clear, narrow temporal angle deep, clear, narrow temporal angle   Iris Round and dilated Round and dilated   Lens Clear Clear   Anterior Vitreous mild syneresis mild syneresis         Fundus Exam       Right Left   Disc Pink and Sharp, mild PPP Pink and Sharp, mild PPP   C/D Ratio 0.2 0.2   Macula Flat, Good foveal reflex, RPE mottling, focal drusen IT to fovea, No heme or edema Blunted foveal reflex, flame hemes and DBH IT macula -- stably improved, trace cystic changes IT macula -- slightly improved, no exudates   Vessels attenuated, mild tortuosity attenuated, Tortuous -- IT BRVO   Periphery Attached Attached           Refraction     Wearing Rx       Sphere Cylinder Axis   Right +2.75 +0.25 117   Left +3.25 +0.25 060           IMAGING AND PROCEDURES  Imaging and Procedures for  12/25/2023  OCT, Retina - OU - Both Eyes       Right Eye Quality was good. Central Foveal Thickness: 257. Progression has been stable. Findings include normal foveal contour, no IRF, no SRF, retinal drusen , vitreomacular adhesion (Rare drusen).   Left Eye Quality was good. Central Foveal Thickness: 244. Progression has been stable. Findings include normal foveal contour, no SRF, intraretinal fluid, vitreomacular adhesion (persistent cystic changes IT macula -- slightly improved).   Notes *Images captured and stored on drive  Diagnosis / Impression:  OD: NFP, no IRF/SRF OS: BRVO w/ CME --  persistent cystic changes IT macula -- slightly improved  Clinical management:  See below  Abbreviations: NFP - Normal foveal profile. CME - cystoid macular edema. PED - pigment epithelial detachment. IRF - intraretinal fluid. SRF - subretinal fluid. EZ - ellipsoid zone. ERM - epiretinal membrane. ORA - outer retinal atrophy. ORT - outer retinal tubulation. SRHM - subretinal hyper-reflective material. IRHM - intraretinal hyper-reflective material      Intravitreal Injection, Pharmacologic Agent - OS - Left Eye       Time Out 12/25/2023. 8:39 AM. Confirmed correct patient, procedure, site, and patient consented.   Anesthesia Topical anesthesia was used. Anesthetic medications included Lidocaine 2%, Proparacaine 0.5%.   Procedure Preparation included 5% betadine to ocular surface, eyelid speculum. A supplied (32g) needle was used.   Injection: 6 mg faricimab -svoa 6 MG/0.05ML   Route: Intravitreal, Site: Left Eye   NDC: 49757-903-93, Lot: [id number], Expiration date: 12/23/2024, Waste: 0 mL   Post-op Post injection exam found visual acuity of at least counting fingers. The patient tolerated the procedure well. There were no complications. The patient received written and verbal post procedure care education. Post injection medications were not given.             ASSESSMENT/PLAN:     ICD-10-CM   1. Branch retinal vein occlusion of left eye with macular edema  H34.8320 OCT, Retina - OU - Both Eyes    Intravitreal Injection, Pharmacologic Agent - OS - Left Eye    faricimab -svoa (VABYSMO ) 6mg /0.26mL intravitreal injection    2. Essential hypertension  I10     3. Hypertensive retinopathy of both eyes  H35.033  BRVO w/ CME OS - s/p IVA OS #1 (06.07.23), #2 (07.06.23), #3 (08.04.23), #4 (09.01.23) -- IVA resistance ======================= - s/p IVE OS #1 (10.06.23), #2 (11.06.23), #3 (12.04.23), #4 (01.08.24), #5 (02.26.24), #6 (04.15.24), #7 (06.03.24) #8 (07.26.24) -- IVE resistance ======================= - s/p IVV OS #1 (09.13.24 -- SAMPLE) , #2 (10.16.24), #3 (11.22.24), #4 (01.03.25), #5 (02.07.25), #6 (03.14.25), #7 (04.18.25), #8 (05.23.25), #9 (06.27.25) ======================= **history of increased IRF at 7 wks on 09.13.24 and 07.26.24 visits; at 6 wks on 01.03.25** - FA (06.02.23) shows focal BRVO inferior macula - originally, pt asymptomatic and didn't realize she had an issue until her routine eye exam - BCVA OS 20/20 -- stable - OCT shows persistent cystic changes IT macula -- slightly improved at 5 wks - recommend IVV OS #10 today, 08.01.25 with f/u ext to 6 weeks - pt wishes to proceed with IVV injection - RBA of procedure discussed, questions answered - IVV informed consent obtained and signed, OS (09.13.24) - see procedure note  - Vabysmo  auth obtained as of 10.18.24 - f/u 5 weeks, DFE, OCT, possible injxn  2,3. Hypertensive retinopathy OU - discussed importance of tight BP control - monitor   Ophthalmic Meds Ordered this visit:  Meds ordered this encounter  Medications   faricimab -svoa (VABYSMO ) 6mg /0.86mL intravitreal injection     Return in about 6 weeks (around 02/05/2024) for f/u BRVO OS, DFE, OCT, Possible Injxn.  There are no Patient Instructions on file for this visit.  This document serves as a record of services personally  performed by Redell JUDITHANN Hans, MD, PhD. It was created on their behalf by Avelina Pereyra, COA an ophthalmic technician. The creation of this record is the provider's dictation and/or activities during the visit.   Electronically signed by: Avelina GORMAN Pereyra, COT  12/30/23  10:47 PM   This document serves as a record of services personally performed by Redell JUDITHANN Hans, MD, PhD. It was created on their behalf by Alan PARAS. Delores, OA an ophthalmic technician. The creation of this record is the provider's dictation and/or activities during the visit.    Electronically signed by: Alan PARAS. Delores, OA 12/30/23 10:47 PM  Redell JUDITHANN Hans, M.D., Ph.D. Diseases & Surgery of the Retina and Vitreous [facility] 12/25/2023   I have reviewed the above documentation for accuracy and completeness, and I agree with the above. Redell JUDITHANN Hans, M.D., Ph.D. 12/30/23 10:48 PM   Abbreviations: M myopia (nearsighted); A astigmatism; H hyperopia (farsighted); P presbyopia; Mrx spectacle prescription;  CTL contact lenses; OD right eye; OS left eye; OU both eyes  XT exotropia; ET esotropia; PEK punctate epithelial keratitis; PEE punctate epithelial erosions; DES dry eye syndrome; MGD meibomian gland dysfunction; ATs artificial tears; PFAT's preservative free artificial tears; NSC nuclear sclerotic cataract; PSC posterior subcapsular cataract; ERM epi-retinal membrane; PVD posterior vitreous detachment; RD retinal detachment; DM diabetes mellitus; DR diabetic retinopathy; NPDR non-proliferative diabetic retinopathy; PDR proliferative diabetic retinopathy; CSME clinically significant macular edema; DME diabetic macular edema; dbh dot blot hemorrhages; CWS cotton wool spot; POAG primary open angle glaucoma; C/D cup-to-disc ratio; HVF humphrey visual field; GVF goldmann visual field; OCT optical coherence tomography; IOP intraocular pressure; BRVO Branch retinal vein occlusion; CRVO central retinal vein  occlusion; CRAO central retinal artery occlusion; BRAO branch retinal artery occlusion; RT retinal tear; SB scleral buckle; PPV pars plana vitrectomy; VH Vitreous hemorrhage; PRP panretinal laser photocoagulation; IVK intravitreal kenalog; VMT vitreomacular traction; MH Macular hole;  NVD neovascularization of the  disc; NVE neovascularization elsewhere; AREDS age related eye disease study; ARMD age related macular degeneration; POAG primary open angle glaucoma; EBMD epithelial/anterior basement membrane dystrophy; ACIOL anterior chamber intraocular lens; IOL intraocular lens; PCIOL posterior chamber intraocular lens; Phaco/IOL phacoemulsification with intraocular lens placement; PRK photorefractive keratectomy; LASIK laser assisted in situ keratomileusis; HTN hypertension; DM diabetes mellitus; COPD chronic obstructive pulmonary disease

## 2023-12-25 ENCOUNTER — Ambulatory Visit (INDEPENDENT_AMBULATORY_CARE_PROVIDER_SITE_OTHER): Admitting: Ophthalmology

## 2023-12-25 ENCOUNTER — Encounter (INDEPENDENT_AMBULATORY_CARE_PROVIDER_SITE_OTHER): Payer: Self-pay | Admitting: Ophthalmology

## 2023-12-25 DIAGNOSIS — I10 Essential (primary) hypertension: Secondary | ICD-10-CM

## 2023-12-25 DIAGNOSIS — H34832 Tributary (branch) retinal vein occlusion, left eye, with macular edema: Secondary | ICD-10-CM

## 2023-12-25 DIAGNOSIS — H35033 Hypertensive retinopathy, bilateral: Secondary | ICD-10-CM | POA: Diagnosis not present

## 2023-12-30 ENCOUNTER — Encounter (INDEPENDENT_AMBULATORY_CARE_PROVIDER_SITE_OTHER): Payer: Self-pay | Admitting: Ophthalmology

## 2023-12-30 MED ORDER — FARICIMAB-SVOA 6 MG/0.05ML IZ SOSY
6.0000 mg | PREFILLED_SYRINGE | INTRAVITREAL | Status: AC | PRN
  Administered 2023-12-30: 6 mg via INTRAVITREAL

## 2024-02-02 NOTE — Progress Notes (Signed)
 [facility] - Clinic Note  02/05/2024     CHIEF COMPLAINT Patient presents for Retina Follow Up   HISTORY OF PRESENT ILLNESS: Sonya Anderson is a 58 y.o. female who presents to the clinic today for:   HPI     Retina Follow Up   Patient presents with  CRVO/BRVO.  In left eye.  Severity is moderate.  Duration of 6.  Since onset it is stable.  I, the attending physician,  performed the HPI with the patient and updated documentation appropriately.        Comments   Pt here for 6 wk ret f/u BRVO OS. Pt states VA is the same. No changes noticed.       Last edited by Valdemar Rogue, MD on 02/05/2024  8:32 AM.     Pt states vision is doing well and has not noticed any vision changes.  Referring physician: Celestine Lunger, OD [address] [place], [place] [address]  HISTORICAL INFORMATION:   Selected notes from the MEDICAL RECORD NUMBER Referred by Dr. Lunger for BRVO w/ CME OS LEE:  Ocular Hx- CME and BRVO OS PMH-    CURRENT MEDICATIONS: No current outpatient medications on file. (Ophthalmic Drugs)   No current facility-administered medications for this visit. (Ophthalmic Drugs)   Current Outpatient Medications (Other)  Medication Sig   omeprazole (PRILOSEC) 40 MG capsule Take by mouth.   No current facility-administered medications for this visit. (Other)   REVIEW OF SYSTEMS: ROS   Positive for: Eyes Negative for: Constitutional, Gastrointestinal, Neurological, Skin, Genitourinary, Musculoskeletal, HENT, Endocrine, Cardiovascular, Respiratory, Psychiatric, Allergic/Imm, Heme/Lymph Last edited by Antonetta Almetta BRAVO, COT on 02/05/2024  7:41 AM.     ALLERGIES No Known Allergies  PAST MEDICAL HISTORY History reviewed. No pertinent past medical history. Past Surgical History:  Procedure Laterality Date   CHOLECYSTECTOMY     FAMILY HISTORY History reviewed. No pertinent family history.  SOCIAL HISTORY Social History   Tobacco Use   Smoking  status: Never   Smokeless tobacco: Never  Vaping Use   Vaping status: Never Used  Substance Use Topics   Alcohol use: Never   Drug use: Never       OPHTHALMIC EXAM:  Base Eye Exam     Visual Acuity (Snellen - Linear)       Right Left   Dist cc 20/20 -1 20/20 -1    Correction: Glasses         Tonometry (Tonopen, 7:44 AM)       Right Left   Pressure 12 12         Pupils       Pupils Dark Light Shape React APD   Right PERRL 3 2 Round Brisk None   Left PERRL 3 2 Round Brisk None         Visual Fields (Counting fingers)       Left Right    Full Full         Extraocular Movement       Right Left    Full, Ortho Full, Ortho         Neuro/Psych     Oriented x3: Yes   Mood/Affect: Normal         Dilation     Both eyes: 1.0% Mydriacyl, 2.5% Phenylephrine @ 7:44 AM           Slit Lamp and Fundus Exam     Slit Lamp Exam  Right Left   Lids/Lashes Dermatochalasis - upper lid Dermatochalasis - upper lid   Conjunctiva/Sclera White and quiet White and quiet   Cornea tear film debris Trace tear film debris   Anterior Chamber deep, clear, narrow temporal angle deep, clear, narrow temporal angle   Iris Round and dilated Round and dilated   Lens Clear Clear   Anterior Vitreous mild syneresis mild syneresis         Fundus Exam       Right Left   Disc Pink and Sharp, mild PPP Pink and Sharp, mild PPP   C/D Ratio 0.2 0.2   Macula Flat, Good foveal reflex, RPE mottling, focal drusen IT to fovea, No heme or edema Blunted foveal reflex, flame hemes and DBH IT macula -- stably improved, trace cystic changes IT macula -- slightly increased, no exudates   Vessels attenuated, mild tortuosity attenuated, Tortuous -- IT BRVO   Periphery Attached Attached           Refraction     Wearing Rx       Sphere Cylinder Axis   Right +2.75 +0.25 117   Left +3.25 +0.25 060           IMAGING AND PROCEDURES  Imaging and Procedures for  02/05/2024  OCT, Retina - OU - Both Eyes       Right Eye Quality was good. Central Foveal Thickness: 265. Progression has been stable. Findings include normal foveal contour, no IRF, no SRF, retinal drusen , vitreomacular adhesion (Rare drusen).   Left Eye Quality was good. Central Foveal Thickness: 252. Progression has been stable. Findings include normal foveal contour, no SRF, intraretinal fluid, vitreomacular adhesion (persistent cystic changes IT macula -- slightly increased).   Notes *Images captured and stored on drive  Diagnosis / Impression:  OD: NFP, no IRF/SRF OS: BRVO w/ CME --  persistent cystic changes IT macula -- slightly increased  Clinical management:  See below  Abbreviations: NFP - Normal foveal profile. CME - cystoid macular edema. PED - pigment epithelial detachment. IRF - intraretinal fluid. SRF - subretinal fluid. EZ - ellipsoid zone. ERM - epiretinal membrane. ORA - outer retinal atrophy. ORT - outer retinal tubulation. SRHM - subretinal hyper-reflective material. IRHM - intraretinal hyper-reflective material      Intravitreal Injection, Pharmacologic Agent - OS - Left Eye       Time Out 02/05/2024. 8:28 AM. Confirmed correct patient, procedure, site, and patient consented.   Anesthesia Topical anesthesia was used. Anesthetic medications included Lidocaine 2%, Proparacaine 0.5%.   Procedure Preparation included 5% betadine to ocular surface, eyelid speculum. A supplied (32g) needle was used.   Injection: 6 mg faricimab -svoa 6 MG/0.05ML   Route: Intravitreal, Site: Left Eye   NDC: 49757-903-93, Lot: [id number], Expiration date: 12/23/2024, Waste: 0 mL   Post-op Post injection exam found visual acuity of at least counting fingers. The patient tolerated the procedure well. There were no complications. The patient received written and verbal post procedure care education. Post injection medications were not given.            ASSESSMENT/PLAN:     ICD-10-CM   1. Branch retinal vein occlusion of left eye with macular edema  H34.8320 OCT, Retina - OU - Both Eyes    Intravitreal Injection, Pharmacologic Agent - OS - Left Eye    faricimab -svoa (VABYSMO ) 6mg /0.9mL intravitreal injection    2. Essential hypertension  I10     3. Hypertensive retinopathy of both eyes  H35.033  BRVO w/ CME OS - s/p IVA OS #1 (06.07.23), #2 (07.06.23), #3 (08.04.23), #4 (09.01.23) -- IVA resistance ======================= - s/p IVE OS #1 (10.06.23), #2 (11.06.23), #3 (12.04.23), #4 (01.08.24), #5 (02.26.24), #6 (04.15.24), #7 (06.03.24) #8 (07.26.24) -- IVE resistance ======================= - s/p IVV OS #1 (09.13.24 -- SAMPLE) , #2 (10.16.24), #3 (11.22.24), #4 (01.03.25), #5 (02.07.25), #6 (03.14.25), #7 (04.18.25), #8 (05.23.25), #9 (06.27.25) #10 (08.01.2025) ======================= **history of increased IRF at 7 wks on 09.13.24 and 07.26.24 visits; at 6 wks on 01.03.25** - FA (06.02.23) shows focal BRVO inferior macula - originally, pt asymptomatic and didn't realize she had an issue until her routine eye exam - BCVA OS 20/20 -- stable - OCT shows persistent cystic changes IT macula -- slightly increased at 6 wks - recommend IVV OS #11 today, 09.12.25 with f/u in 6 weeks - pt wishes to proceed with IVV injection - RBA of procedure discussed, questions answered - IVV informed consent obtained and signed, OS (09.13.24) - see procedure note  - Vabysmo  auth obtained as of 10.18.24 - f/u 6 weeks, DFE, OCT, possible injxn  2,3. Hypertensive retinopathy OU - discussed importance of tight BP control - monitor   Ophthalmic Meds Ordered this visit:  Meds ordered this encounter  Medications   faricimab -svoa (VABYSMO ) 6mg /0.35mL intravitreal injection     Return in about 5 weeks (around 03/11/2024) for f/u, BRVO, DFE, OCT, Possible, IVV, OS.  There are no Patient Instructions on file for this visit.  This document serves as a record of  services personally performed by Redell JUDITHANN Hans, MD, PhD. It was created on their behalf by Delon Newness COT, an ophthalmic technician. The creation of this record is the provider's dictation and/or activities during the visit.    Electronically signed by: Delon Newness COT 09.09.2025 3:28 AM  This document serves as a record of services personally performed by Redell JUDITHANN Hans, MD, PhD. It was created on their behalf by Wanda GEANNIE Keens, COT an ophthalmic technician. The creation of this record is the provider's dictation and/or activities during the visit.    Electronically signed by:  Wanda GEANNIE Keens, COT  02/12/24 3:28 AM  Redell JUDITHANN Hans, M.D., Ph.D. Diseases & Surgery of the Retina and Vitreous [facility] 02/05/2024   I have reviewed the above documentation for accuracy and completeness, and I agree with the above. Redell JUDITHANN Hans, M.D., Ph.D. 02/12/24 3:28 AM   Abbreviations: M myopia (nearsighted); A astigmatism; H hyperopia (farsighted); P presbyopia; Mrx spectacle prescription;  CTL contact lenses; OD right eye; OS left eye; OU both eyes  XT exotropia; ET esotropia; PEK punctate epithelial keratitis; PEE punctate epithelial erosions; DES dry eye syndrome; MGD meibomian gland dysfunction; ATs artificial tears; PFAT's preservative free artificial tears; NSC nuclear sclerotic cataract; PSC posterior subcapsular cataract; ERM epi-retinal membrane; PVD posterior vitreous detachment; RD retinal detachment; DM diabetes mellitus; DR diabetic retinopathy; NPDR non-proliferative diabetic retinopathy; PDR proliferative diabetic retinopathy; CSME clinically significant macular edema; DME diabetic macular edema; dbh dot blot hemorrhages; CWS cotton wool spot; POAG primary open angle glaucoma; C/D cup-to-disc ratio; HVF humphrey visual field; GVF goldmann visual field; OCT optical coherence tomography; IOP intraocular pressure; BRVO Branch retinal vein  occlusion; CRVO central retinal vein occlusion; CRAO central retinal artery occlusion; BRAO branch retinal artery occlusion; RT retinal tear; SB scleral buckle; PPV pars plana vitrectomy; VH Vitreous hemorrhage; PRP panretinal laser photocoagulation; IVK intravitreal kenalog; VMT vitreomacular traction; MH Macular hole;  NVD neovascularization of the  disc; NVE neovascularization elsewhere; AREDS age related eye disease study; ARMD age related macular degeneration; POAG primary open angle glaucoma; EBMD epithelial/anterior basement membrane dystrophy; ACIOL anterior chamber intraocular lens; IOL intraocular lens; PCIOL posterior chamber intraocular lens; Phaco/IOL phacoemulsification with intraocular lens placement; PRK photorefractive keratectomy; LASIK laser assisted in situ keratomileusis; HTN hypertension; DM diabetes mellitus; COPD chronic obstructive pulmonary disease

## 2024-02-05 ENCOUNTER — Ambulatory Visit (INDEPENDENT_AMBULATORY_CARE_PROVIDER_SITE_OTHER): Admitting: Ophthalmology

## 2024-02-05 ENCOUNTER — Encounter (INDEPENDENT_AMBULATORY_CARE_PROVIDER_SITE_OTHER): Payer: Self-pay | Admitting: Ophthalmology

## 2024-02-05 DIAGNOSIS — I10 Essential (primary) hypertension: Secondary | ICD-10-CM | POA: Diagnosis not present

## 2024-02-05 DIAGNOSIS — H35033 Hypertensive retinopathy, bilateral: Secondary | ICD-10-CM | POA: Diagnosis not present

## 2024-02-05 DIAGNOSIS — H34832 Tributary (branch) retinal vein occlusion, left eye, with macular edema: Secondary | ICD-10-CM | POA: Diagnosis not present

## 2024-02-05 MED ORDER — FARICIMAB-SVOA 6 MG/0.05ML IZ SOSY
6.0000 mg | PREFILLED_SYRINGE | INTRAVITREAL | Status: AC | PRN
  Administered 2024-02-05: 6 mg via INTRAVITREAL

## 2024-02-15 ENCOUNTER — Encounter: Payer: Self-pay | Admitting: Cardiology

## 2024-02-15 ENCOUNTER — Ambulatory Visit: Payer: Self-pay | Attending: Cardiology | Admitting: Cardiology

## 2024-02-15 VITALS — BP 144/90 | HR 79 | Ht 64.0 in | Wt 167.0 lb

## 2024-02-15 DIAGNOSIS — R002 Palpitations: Secondary | ICD-10-CM

## 2024-02-15 MED ORDER — METOPROLOL TARTRATE 25 MG PO TABS: 25.0000 mg | ORAL_TABLET | Freq: Three times a day (TID) | ORAL | 3 refills | Status: DC | PRN

## 2024-02-15 NOTE — Progress Notes (Signed)
      Clinical Summary Sonya Anderson is a 58 y.o.female seen today as a new consult, referred by Sonya Anderson for the following medical problems.   1.Palpitations - pcp note reports history of SVT however I don't see necessarily that rhythm has been captured.  - 12/28/23 EKG from pcp shows NSR  - several year history of short infrequent palpitations - 2 episodes about 5 weeks ago.   - significant on memorial day. Occurred in early AM, was getting ready - heart started to race. No other associated symptoms. Lasted about 1 hour. Heart rates up 199.  - went to her pcp, did some vasovagal maneuvers with improved - second episode about 4 weeks later. Was exercising had repeat episode lasted 45 minutes.  - had been drinking coffee x 1-2 cups, limited sodas, occasional tea, no energy drinks. Occasional beer/mixed drink on the weekend.   Works at [employer]. Working 24 years.  Past Medical History:  Diagnosis Date   DJD (degenerative joint disease)    GERD (gastroesophageal reflux disease)      No Known Allergies   Current Outpatient Medications  Medication Sig Dispense Refill   ALPRAZolam (XANAX) 0.5 MG tablet Take 0.5 mg by mouth 2 (two) times daily.     busPIRone (BUSPAR) 5 MG tablet Take 5 mg by mouth 2 (two) times daily.     hydrOXYzine (ATARAX) 25 MG tablet Take 25 mg by mouth daily.     omeprazole (PRILOSEC) 40 MG capsule Take by mouth.     No current facility-administered medications for this visit.     Past Surgical History:  Procedure Laterality Date   CESAREAN SECTION     CHOLECYSTECTOMY     SPINAL FUSION     WISDOM TOOTH EXTRACTION       No Known Allergies    Family History  Problem Relation Age of Onset   Heart failure Mother    Diabetes Father      Social History Sonya Anderson reports that she has never smoked. She has never used smokeless tobacco. Sonya Anderson reports no history of alcohol use.    Physical Examination Vitals:   02/15/24 1057  02/15/24 1137  BP: (!) 144/88 (!) 144/90  Pulse: 79   SpO2: 97%    Filed Weights   02/15/24 1057  Weight: 167 lb (75.8 kg)    Gen: resting comfortably, no acute distress HEENT: no scleral icterus, pupils equal round and reactive, no palptable cervical adenopathy,  CV: RRR, no mrg, no jvd Resp: Clear to auscultation bilaterally GI: abdomen is soft, non-tender, non-distended, normal bowel sounds, no hepatosplenomegaly MSK: extremities are warm, no edema.  Skin: warm, no rash Neuro:  no focal deficits Psych: appropriate affect     Assessment and Plan  1.Tachycardia - fairly infrequent episodes, at this time would monitor clinically. Start lopressor  25mg  every 8 hrs prn palpitations - if increase in frequency would consider monitor at that time. - most likely SVT given history, response to vagal maneuvers. The arrhythmia however has not been captured by EKG or telemetry - continue to limit caffeine.    F/u 6 months      Sonya Anderson, M.D.

## 2024-02-15 NOTE — Patient Instructions (Signed)
 Medication Instructions:  Your physician has recommended you make the following change in your medication:   -Start Lopressor  25 mg every 8 hours as needed for palpitations.   *If you need a refill on your cardiac medications before your next appointment, please call your pharmacy*  Lab Work: None If you have labs (blood work) drawn today and your tests are completely normal, you will receive your results only by: MyChart Message (if you have MyChart) OR A paper copy in the mail If you have any lab test that is abnormal or we need to change your treatment, we will call you to review the results.  Testing/Procedures: None  Follow-Up: At [facility], you and your health needs are our priority.  As part of our continuing mission to provide you with exceptional heart care, our providers are all part of one team.  This team includes your primary Cardiologist (physician) and Advanced Practice Providers or APPs (Physician Assistants and Nurse Practitioners) who all work together to provide you with the care you need, when you need it.  Your next appointment:   6 month(s)  Provider:   You may see Alvan Carrier, MD or one of the following Advanced Practice Providers on your designated Care Team:   Laymon Qua, PA-C  [place], [place] Olivia [place], [place]     We recommend signing up for the patient portal called MyChart.  Sign up information is provided on this After Visit Summary.  MyChart is used to connect with patients for Virtual Visits (Telemedicine).  Patients are able to view lab/test results, encounter notes, upcoming appointments, etc.  Non-urgent messages can be sent to your provider as well.   To learn more about what you can do with MyChart, go to [email or web address].   Other Instructions

## 2024-03-08 NOTE — Progress Notes (Signed)
 [facility] - Clinic Note  03/11/2024     CHIEF COMPLAINT Patient presents for Retina Follow Up   HISTORY OF PRESENT ILLNESS: Sonya Anderson is a 58 y.o. female who presents to the clinic today for:   HPI     Retina Follow Up   Patient presents with  CRVO/BRVO.  In left eye.  Severity is mild.  Duration of 5 weeks.  Since onset it is stable.        Comments   5 week Retina eval. Patient states vision seems about the same      Last edited by German Olam BRAVO, COT on 03/11/2024  7:53 AM.      Pt states vision is doing well and has not noticed any vision changes.  Referring physician: Celestine Lunger, OD [address] [place], [place] [address]  HISTORICAL INFORMATION:   Selected notes from the MEDICAL RECORD NUMBER Referred by Dr. Lunger for BRVO w/ CME OS LEE:  Ocular Hx- CME and BRVO OS PMH-    CURRENT MEDICATIONS: No current outpatient medications on file. (Ophthalmic Drugs)   No current facility-administered medications for this visit. (Ophthalmic Drugs)   Current Outpatient Medications (Other)  Medication Sig   ALPRAZolam (XANAX) 0.5 MG tablet Take 0.5 mg by mouth 2 (two) times daily.   busPIRone (BUSPAR) 5 MG tablet Take 5 mg by mouth 2 (two) times daily.   hydrOXYzine (ATARAX) 25 MG tablet Take 25 mg by mouth daily.   metoprolol  tartrate (LOPRESSOR ) 25 MG tablet Take 1 tablet (25 mg total) by mouth every 8 (eight) hours as needed (Palpitations).   omeprazole (PRILOSEC) 40 MG capsule Take by mouth.   No current facility-administered medications for this visit. (Other)   REVIEW OF SYSTEMS: ROS   Positive for: Eyes Negative for: Constitutional, Gastrointestinal, Neurological, Skin, Genitourinary, Musculoskeletal, HENT, Endocrine, Cardiovascular, Respiratory, Psychiatric, Allergic/Imm, Heme/Lymph Last edited by German Olam BRAVO, COT on 03/11/2024  7:51 AM.      ALLERGIES No Known Allergies  PAST MEDICAL HISTORY Past Medical History:   Diagnosis Date   DJD (degenerative joint disease)    GERD (gastroesophageal reflux disease)    Past Surgical History:  Procedure Laterality Date   CESAREAN SECTION     CHOLECYSTECTOMY     SPINAL FUSION     WISDOM TOOTH EXTRACTION     FAMILY HISTORY Family History  Problem Relation Age of Onset   Heart failure Mother    Diabetes Father     SOCIAL HISTORY Social History   Tobacco Use   Smoking status: Never   Smokeless tobacco: Never  Vaping Use   Vaping status: Never Used  Substance Use Topics   Alcohol use: Never   Drug use: Never       OPHTHALMIC EXAM:  Base Eye Exam     Visual Acuity (Snellen - Linear)       Right Left   Dist cc 20/20 -1 20/20    Correction: Glasses         Tonometry (Tonopen, 7:52 AM)       Right Left   Pressure 8 8         Pupils       Dark Light Shape React APD   Right 3 2 Round Brisk None   Left 3 2 Round Brisk None         Visual Fields (Counting fingers)       Left Right    Full  Full         Extraocular Movement       Right Left    Full, Ortho Full, Ortho         Neuro/Psych     Oriented x3: Yes   Mood/Affect: Normal         Dilation     Both eyes: 1.0% Mydriacyl, 2.5% Phenylephrine @ 7:52 AM           Slit Lamp and Fundus Exam     Slit Lamp Exam       Right Left   Lids/Lashes Dermatochalasis - upper lid Dermatochalasis - upper lid   Conjunctiva/Sclera White and quiet White and quiet   Cornea tear film debris Trace tear film debris   Anterior Chamber deep, clear, narrow temporal angle deep, clear, narrow temporal angle   Iris Round and dilated Round and dilated   Lens Clear Clear   Anterior Vitreous mild syneresis mild syneresis         Fundus Exam       Right Left   Disc Pink and Sharp, mild PPP Pink and Sharp, mild PPP   C/D Ratio 0.2 0.2   Macula Flat, Good foveal reflex, RPE mottling, focal drusen IT to fovea, No heme or edema Blunted foveal reflex, flame hemes and DBH  IT macula -- stably improved, trace cystic changes IT macula -- slightly increased, no exudates   Vessels attenuated, mild tortuosity attenuated, Tortuous -- IT BRVO   Periphery Attached Attached           Refraction     Wearing Rx       Sphere Cylinder Axis   Right +2.75 +0.25 117   Left +3.25 +0.25 060           IMAGING AND PROCEDURES  Imaging and Procedures for 03/11/2024          ASSESSMENT/PLAN:    ICD-10-CM   1. Branch retinal vein occlusion of left eye with macular edema (HCC)  H34.8320 OCT, Retina - OU - Both Eyes    2. Essential hypertension  I10     3. Hypertensive retinopathy of both eyes  H35.033      BRVO w/ CME OS - s/p IVA OS #1 (06.07.23), #2 (07.06.23), #3 (08.04.23), #4 (09.01.23) -- IVA resistance ======================= - s/p IVE OS #1 (10.06.23), #2 (11.06.23), #3 (12.04.23), #4 (01.08.24), #5 (02.26.24), #6 (04.15.24), #7 (06.03.24) #8 (07.26.24) -- IVE resistance ======================= - s/p IVV OS #1 (09.13.24 -- SAMPLE) , #2 (10.16.24), #3 (11.22.24), #4 (01.03.25), #5 (02.07.25), #6 (03.14.25), #7 (04.18.25), #8 (05.23.25), #9 (06.27.25) #10 (08.01.2025) #11 (09.12.25) ======================= **history of increased IRF at 7 wks on 09.13.24 and 07.26.24 visits; at 6 wks on 01.03.25** - FA (06.02.23) shows focal BRVO inferior macula - originally, pt asymptomatic and didn't realize she had an issue until her routine eye exam - BCVA OS 20/20 -- stable - OCT shows persistent cystic changes IT macula -- slightly increased at 5 wks--consider focal laser next visit - recommend IVV OS #12 today, 10.17.25 with f/u in 5 weeks - **discussed decreased efficacy / resistance to Vabysmo  and potential benefit of switching to Eylea  HD**  - pt wishes to proceed with IVV injection - RBA of procedure discussed, questions answered - IVV informed consent obtained and signed, OS (09.13.24) - see procedure note  - Vabysmo  auth obtained as of 10.18.24 - f/u  5 weeks, DFE, OCT, possible injxn--considering focal laser OS  2,3. Hypertensive retinopathy OU - discussed  importance of tight BP control - monitor   Ophthalmic Meds Ordered this visit:  No orders of the defined types were placed in this encounter.    No follow-ups on file.  There are no Patient Instructions on file for this visit. This document serves as a record of services personally performed by Redell JUDITHANN Hans, MD, PhD. It was created on their behalf by Delon Newness COT, an ophthalmic technician. The creation of this record is the provider's dictation and/or activities during the visit.    Electronically signed by: Delon Newness COT 10.14.25 8:56 AM This document serves as a record of services personally performed by Redell JUDITHANN Hans, MD, PhD. It was created on their behalf by Wanda GEANNIE Keens, COT an ophthalmic technician. The creation of this record is the provider's dictation and/or activities during the visit.    Electronically signed by:  Wanda GEANNIE Keens, COT  03/11/24 8:56 AM   Abbreviations: M myopia (nearsighted); A astigmatism; H hyperopia (farsighted); P presbyopia; Mrx spectacle prescription;  CTL contact lenses; OD right eye; OS left eye; OU both eyes  XT exotropia; ET esotropia; PEK punctate epithelial keratitis; PEE punctate epithelial erosions; DES dry eye syndrome; MGD meibomian gland dysfunction; ATs artificial tears; PFAT's preservative free artificial tears; NSC nuclear sclerotic cataract; PSC posterior subcapsular cataract; ERM epi-retinal membrane; PVD posterior vitreous detachment; RD retinal detachment; DM diabetes mellitus; DR diabetic retinopathy; NPDR non-proliferative diabetic retinopathy; PDR proliferative diabetic retinopathy; CSME clinically significant macular edema; DME diabetic macular edema; dbh dot blot hemorrhages; CWS cotton wool spot; POAG primary open angle glaucoma; C/D cup-to-disc ratio; HVF humphrey visual field; GVF goldmann  visual field; OCT optical coherence tomography; IOP intraocular pressure; BRVO Branch retinal vein occlusion; CRVO central retinal vein occlusion; CRAO central retinal artery occlusion; BRAO branch retinal artery occlusion; RT retinal tear; SB scleral buckle; PPV pars plana vitrectomy; VH Vitreous hemorrhage; PRP panretinal laser photocoagulation; IVK intravitreal kenalog; VMT vitreomacular traction; MH Macular hole;  NVD neovascularization of the disc; NVE neovascularization elsewhere; AREDS age related eye disease study; ARMD age related macular degeneration; POAG primary open angle glaucoma; EBMD epithelial/anterior basement membrane dystrophy; ACIOL anterior chamber intraocular lens; IOL intraocular lens; PCIOL posterior chamber intraocular lens; Phaco/IOL phacoemulsification with intraocular lens placement; PRK photorefractive keratectomy; LASIK laser assisted in situ keratomileusis; HTN hypertension; DM diabetes mellitus; COPD chronic obstructive pulmonary disease

## 2024-03-11 ENCOUNTER — Encounter (INDEPENDENT_AMBULATORY_CARE_PROVIDER_SITE_OTHER): Payer: Self-pay | Admitting: Ophthalmology

## 2024-03-11 ENCOUNTER — Ambulatory Visit (INDEPENDENT_AMBULATORY_CARE_PROVIDER_SITE_OTHER): Admitting: Ophthalmology

## 2024-03-11 DIAGNOSIS — H35033 Hypertensive retinopathy, bilateral: Secondary | ICD-10-CM

## 2024-03-11 DIAGNOSIS — H34832 Tributary (branch) retinal vein occlusion, left eye, with macular edema: Secondary | ICD-10-CM | POA: Diagnosis not present

## 2024-03-11 DIAGNOSIS — I10 Essential (primary) hypertension: Secondary | ICD-10-CM

## 2024-03-11 MED ORDER — FARICIMAB-SVOA 6 MG/0.05ML IZ SOSY
6.0000 mg | PREFILLED_SYRINGE | INTRAVITREAL | Status: AC | PRN
  Administered 2024-03-11: 6 mg via INTRAVITREAL

## 2024-04-01 ENCOUNTER — Ambulatory Visit: Admitting: Cardiology

## 2024-04-04 ENCOUNTER — Ambulatory Visit: Admitting: Cardiology

## 2024-04-12 NOTE — Progress Notes (Signed)
 [facility] - Clinic Note  04/15/2024     CHIEF COMPLAINT Patient presents for Retina Follow Up   HISTORY OF PRESENT ILLNESS: Sonya Anderson is a 58 y.o. female who presents to the clinic today for:   HPI     Retina Follow Up   Patient presents with  CRVO/BRVO.  In left eye.  This started 2.5 years ago.  Severity is mild.  Duration of 5 weeks.  Since onset it is stable.  I, the attending physician,  performed the HPI with the patient and updated documentation appropriately.        Comments   Pt denies any changes in vision/no FOL/floaters/pain. Pt has ATS but hardly uses them.      Last edited by Valdemar Rogue, MD on 04/15/2024 12:52 PM.     Pt states she has had some issues w/ her tachycardia twice this year. Saw a cardiologist and was placed on metoprolol  PRN. Had another spell 3 weeks ago w/ a heart rate of 203 along with a raise in BP. Went to the ER. Was decided that she'd be on metoprolol  BID to prevent tachycardia and elevated BP.   Referring physician: Celestine Lunger, OD [address] [place], [place] [address]  HISTORICAL INFORMATION:   Selected notes from the MEDICAL RECORD NUMBER Referred by Dr. Lunger for BRVO w/ CME OS LEE:  Ocular Hx- CME and BRVO OS PMH-    CURRENT MEDICATIONS: No current outpatient medications on file. (Ophthalmic Drugs)   No current facility-administered medications for this visit. (Ophthalmic Drugs)   Current Outpatient Medications (Other)  Medication Sig   ALPRAZolam (XANAX) 0.5 MG tablet Take 0.5 mg by mouth 2 (two) times daily.   busPIRone (BUSPAR) 5 MG tablet Take 5 mg by mouth 2 (two) times daily.   hydrOXYzine (ATARAX) 25 MG tablet Take 25 mg by mouth daily.   metoprolol  tartrate (LOPRESSOR ) 25 MG tablet Take 1 tablet (25 mg total) by mouth every 8 (eight) hours as needed (Palpitations).   omeprazole (PRILOSEC) 40 MG capsule Take by mouth.   No current facility-administered medications for this visit. (Other)    REVIEW OF SYSTEMS: ROS   Positive for: Eyes Negative for: Constitutional, Gastrointestinal, Neurological, Skin, Genitourinary, Musculoskeletal, HENT, Endocrine, Cardiovascular, Respiratory, Psychiatric, Allergic/Imm, Heme/Lymph Last edited by Elnor Avelina RAMAN, COT on 04/15/2024  9:18 AM.       ALLERGIES No Known Allergies  PAST MEDICAL HISTORY Past Medical History:  Diagnosis Date   DJD (degenerative joint disease)    GERD (gastroesophageal reflux disease)    Past Surgical History:  Procedure Laterality Date   CESAREAN SECTION     CHOLECYSTECTOMY     SPINAL FUSION     WISDOM TOOTH EXTRACTION     FAMILY HISTORY Family History  Problem Relation Age of Onset   Heart failure Mother    Diabetes Father     SOCIAL HISTORY Social History   Tobacco Use   Smoking status: Never   Smokeless tobacco: Never  Vaping Use   Vaping status: Never Used  Substance Use Topics   Alcohol use: Never   Drug use: Never       OPHTHALMIC EXAM:  Base Eye Exam     Visual Acuity (Snellen - Linear)       Right Left   Dist cc 20/20 -1 20/20 -1    Correction: Glasses         Tonometry (Tonopen, 9:22 AM)  Right Left   Pressure 13 13         Pupils       Pupils Dark Light Shape React APD   Right PERRL 4 2 Round Brisk None   Left PERRL 4 2 Round Brisk None         Visual Fields       Left Right    Full Full         Extraocular Movement       Right Left    Full, Ortho Full, Ortho         Neuro/Psych     Oriented x3: Yes   Mood/Affect: Normal         Dilation     Both eyes: 1.0% Mydriacyl, 2.5% Phenylephrine @ 9:22 AM           Slit Lamp and Fundus Exam     Slit Lamp Exam       Right Left   Lids/Lashes Dermatochalasis - upper lid Dermatochalasis - upper lid   Conjunctiva/Sclera White and quiet White and quiet   Cornea tear film debris Trace tear film debris   Anterior Chamber deep, clear, narrow temporal angle deep, clear, narrow  temporal angle   Iris Round and dilated Round and dilated   Lens Clear Clear   Anterior Vitreous mild syneresis mild syneresis         Fundus Exam       Right Left   Disc Pink and Sharp, mild PPP Pink and Sharp, mild PPP   C/D Ratio 0.2 0.2   Macula Flat, Good foveal reflex, RPE mottling, focal drusen IT to fovea, No heme or edema Blunted foveal reflex, flame hemes and DBH IT macula -- stably improved, trace cystic changes IT macula -- slightly improved, no exudates   Vessels attenuated, mild tortuosity attenuated, Tortuous -- IT BRVO   Periphery Attached Attached           Refraction     Wearing Rx       Sphere Cylinder Axis   Right +2.75 +0.25 117   Left +3.25 +0.25 060           IMAGING AND PROCEDURES  Imaging and Procedures for 04/15/2024  OCT, Retina - OU - Both Eyes       Right Eye Quality was good. Central Foveal Thickness: 268. Progression has been stable. Findings include normal foveal contour, no IRF, no SRF, retinal drusen , vitreomacular adhesion (Rare drusen).   Left Eye Quality was good. Central Foveal Thickness: 256. Progression has improved. Findings include normal foveal contour, no SRF, intraretinal fluid, vitreomacular adhesion (persistent cystic changes IT macula -- slightly improved).   Notes *Images captured and stored on drive  Diagnosis / Impression:  OD: NFP, no IRF/SRF OS: BRVO w/ CME --  persistent cystic changes IT macula -- slightly improved  Clinical management:  See below  Abbreviations: NFP - Normal foveal profile. CME - cystoid macular edema. PED - pigment epithelial detachment. IRF - intraretinal fluid. SRF - subretinal fluid. EZ - ellipsoid zone. ERM - epiretinal membrane. ORA - outer retinal atrophy. ORT - outer retinal tubulation. SRHM - subretinal hyper-reflective material. IRHM - intraretinal hyper-reflective material      Intravitreal Injection, Pharmacologic Agent - OS - Left Eye       Time Out 04/15/2024.  10:43 AM. Confirmed correct patient, procedure, site, and patient consented.   Anesthesia Topical anesthesia was used. Anesthetic medications included Lidocaine 2%, Proparacaine 0.5%.  Procedure Preparation included 5% betadine to ocular surface, eyelid speculum. A supplied (32g) needle was used.   Injection: 6 mg faricimab -svoa 6 MG/0.05ML   Route: Intravitreal, Site: Left Eye   NDC: 49757-903-93, Lot: [id number], Expiration date: 02/22/2025, Waste: 0 mL   Post-op Post injection exam found visual acuity of at least counting fingers. The patient tolerated the procedure well. There were no complications. The patient received written and verbal post procedure care education. Post injection medications were not given.             ASSESSMENT/PLAN:    ICD-10-CM   1. Branch retinal vein occlusion of left eye with macular edema (HCC)  H34.8320 OCT, Retina - OU - Both Eyes    Intravitreal Injection, Pharmacologic Agent - OS - Left Eye    faricimab -svoa (VABYSMO ) 6mg /0.8mL intravitreal injection    2. Essential hypertension  I10     3. Hypertensive retinopathy of both eyes  H35.033      BRVO w/ CME OS - s/p IVA OS #1 (06.07.23), #2 (07.06.23), #3 (08.04.23), #4 (09.01.23) -- IVA resistance ======================= - s/p IVE OS #1 (10.06.23), #2 (11.06.23), #3 (12.04.23), #4 (01.08.24), #5 (02.26.24), #6 (04.15.24), #7 (06.03.24) #8 (07.26.24) -- IVE resistance ======================= - s/p IVV OS #1 (09.13.24 -- SAMPLE) , #2 (10.16.24), #3 (11.22.24), #4 (01.03.25), #5 (02.07.25), #6 (03.14.25), #7 (04.18.25), #8 (05.23.25), #9 (06.27.25) #10 (08.01.2025) #11 (09.12.25) #12(10.17.25) ======================= **history of increased IRF at 7 wks on 09.13.24 and 07.26.24 visits; at 6 wks on 01.03.25** - FA (06.02.23) shows focal BRVO inferior macula - originally, pt asymptomatic and didn't realize she had an issue until her routine eye exam - BCVA OS 20/20 -- stable - OCT shows  persistent cystic changes IT macula -- slightly improved at 5 wks--consider focal laser in future - recommend IVV OS #13 today, 11.21.25 with f/u in 4 weeks due to holiday - pt wishes to proceed with IVV injection - RBA of procedure discussed, questions answered - IVV informed consent obtained and signed, OS (09.13.24) - see procedure note  - Vabysmo  auth obtained as of 10.18.24 - f/u 4 weeks, DFE, OCT, possible injxn--considering focal laser OS  2,3. Hypertensive retinopathy OU - discussed importance of tight BP control - monitor   Ophthalmic Meds Ordered this visit:  Meds ordered this encounter  Medications   faricimab -svoa (VABYSMO ) 6mg /0.58mL intravitreal injection     Return in about 4 weeks (around 05/13/2024) for BRVO OS, DFE, OCT, Possible Injxn.  There are no Patient Instructions on file for this visit.  This document serves as a record of services personally performed by Redell JUDITHANN Hans, MD, PhD. It was created on their behalf by Delon Newness COT, an ophthalmic technician. The creation of this record is the provider's dictation and/or activities during the visit.    Electronically signed by: Delon Newness COT 11.18.25 1:57 AM  This document serves as a record of services personally performed by Redell JUDITHANN Hans, MD, PhD. It was created on their behalf by Almetta Pesa, an ophthalmic technician. The creation of this record is the provider's dictation and/or activities during the visit.    Electronically signed by: Almetta Pesa, OA, 04/18/24  1:57 AM  Redell JUDITHANN Hans, M.D., Ph.D. Diseases & Surgery of the Retina and Vitreous [facility] 04/15/2024   I have reviewed the above documentation for accuracy and completeness, and I agree with the above. Redell JUDITHANN Hans, M.D., Ph.D. 04/18/24 2:11 AM   Abbreviations: M myopia (nearsighted);  A astigmatism; H hyperopia (farsighted); P presbyopia; Mrx spectacle prescription;  CTL contact  lenses; OD right eye; OS left eye; OU both eyes  XT exotropia; ET esotropia; PEK punctate epithelial keratitis; PEE punctate epithelial erosions; DES dry eye syndrome; MGD meibomian gland dysfunction; ATs artificial tears; PFAT's preservative free artificial tears; NSC nuclear sclerotic cataract; PSC posterior subcapsular cataract; ERM epi-retinal membrane; PVD posterior vitreous detachment; RD retinal detachment; DM diabetes mellitus; DR diabetic retinopathy; NPDR non-proliferative diabetic retinopathy; PDR proliferative diabetic retinopathy; CSME clinically significant macular edema; DME diabetic macular edema; dbh dot blot hemorrhages; CWS cotton wool spot; POAG primary open angle glaucoma; C/D cup-to-disc ratio; HVF humphrey visual field; GVF goldmann visual field; OCT optical coherence tomography; IOP intraocular pressure; BRVO Branch retinal vein occlusion; CRVO central retinal vein occlusion; CRAO central retinal artery occlusion; BRAO branch retinal artery occlusion; RT retinal tear; SB scleral buckle; PPV pars plana vitrectomy; VH Vitreous hemorrhage; PRP panretinal laser photocoagulation; IVK intravitreal kenalog; VMT vitreomacular traction; MH Macular hole;  NVD neovascularization of the disc; NVE neovascularization elsewhere; AREDS age related eye disease study; ARMD age related macular degeneration; POAG primary open angle glaucoma; EBMD epithelial/anterior basement membrane dystrophy; ACIOL anterior chamber intraocular lens; IOL intraocular lens; PCIOL posterior chamber intraocular lens; Phaco/IOL phacoemulsification with intraocular lens placement; PRK photorefractive keratectomy; LASIK laser assisted in situ keratomileusis; HTN hypertension; DM diabetes mellitus; COPD chronic obstructive pulmonary disease

## 2024-04-15 ENCOUNTER — Encounter (INDEPENDENT_AMBULATORY_CARE_PROVIDER_SITE_OTHER): Payer: Self-pay | Admitting: Ophthalmology

## 2024-04-15 ENCOUNTER — Ambulatory Visit (INDEPENDENT_AMBULATORY_CARE_PROVIDER_SITE_OTHER): Admitting: Ophthalmology

## 2024-04-15 DIAGNOSIS — H34832 Tributary (branch) retinal vein occlusion, left eye, with macular edema: Secondary | ICD-10-CM

## 2024-04-15 DIAGNOSIS — I10 Essential (primary) hypertension: Secondary | ICD-10-CM

## 2024-04-15 DIAGNOSIS — H35033 Hypertensive retinopathy, bilateral: Secondary | ICD-10-CM | POA: Diagnosis not present

## 2024-04-15 MED ORDER — FARICIMAB-SVOA 6 MG/0.05ML IZ SOSY
6.0000 mg | PREFILLED_SYRINGE | INTRAVITREAL | Status: AC | PRN
  Administered 2024-04-15: 6 mg via INTRAVITREAL

## 2024-05-03 ENCOUNTER — Ambulatory Visit

## 2024-05-03 ENCOUNTER — Ambulatory Visit: Attending: Physician Assistant | Admitting: Physician Assistant

## 2024-05-03 ENCOUNTER — Encounter: Payer: Self-pay | Admitting: Physician Assistant

## 2024-05-03 VITALS — BP 138/80 | HR 68 | Ht 64.0 in | Wt 167.0 lb

## 2024-05-03 DIAGNOSIS — R002 Palpitations: Secondary | ICD-10-CM

## 2024-05-03 DIAGNOSIS — I471 Supraventricular tachycardia, unspecified: Secondary | ICD-10-CM

## 2024-05-03 DIAGNOSIS — R0789 Other chest pain: Secondary | ICD-10-CM

## 2024-05-03 NOTE — Progress Notes (Signed)
 Cardiology Office Note:  .   Date:  05/03/2024  ID:  Macario LELON Free, DOB 01/24/1966, MRN [id number] PCP: Dow Longs, PA-C  [facility] Providers Cardiologist:  Alvan Carrier, MD {   History of Present Illness: .   Sonya Anderson is a 58 y.o. female  with PMHx of Palpitations/SVT, GERD, HPV, postmenopausal bleeding who reports to [facility] office for follow up.   Last seen in heartcare 02/15/2024 Dr. Alvan for new consult related to palpitations.  PCP reported history of SVT, however not able to find on EKG or rhythm strip.  Reviewed EKG from 8//2025 from PCP, which was NSR.  Reported several years of short and frequent palpitations described as heart racing with 2 episodes about 5 weeks ago. One episode occurred in the early a.m. while getting ready lasted about a hour with HR up to 199.  He followed up with PCP who did vagal maneuvers with improvement.  Another episode occurred while exercising lasting 45 minutes. Due to infrequent episodes, recommended medical management.  Started Lopressor  25 mg every 8 hours as needed for palpitations.  Suspected most likely SVT given history and response to vagal maneuver.  Recent ED visit at [facility] 03/28/2024 for palpitations and diagnosed with SVT.  Reviewed EKG, which confirmed SVT with a rate of 152 bpm.  CBC/TSH WNL. T4 Low at 0.71. K borderline low normal at 3.6. Mg borderline low normal at 1.7. She was converted back to NSR with vagal maneuver.  Treated with IV bolus.  Discharged with no med changes.  Today, reports no recurrent palpitations since ED visit.  Shortly after ED visit, PCP switched Lopressor  from PRN to 25 mg BID.  Also reports intermittent chest pain described as twinges lasting for seconds occurring multiple times per day since ED visit.  She has still been able to workout at the gym 3-4 times per week without any concerns. Denies angina, shortness of breath, syncope, presyncope, dizziness, orthopnea, PND, swelling or  significant weight changes, acute bleeding, or claudication.  Reports compliance with medications.  Reports low-sodium diet and denies excessive caffeine use. Works at [employer]. She quit smoking about 30 years ago after 10 years.  She is a social drinker. Denies drug use   ROS: 10 point review of system has been reviewed and considered negative except ones been listed in the HPI.   Studies Reviewed: SABRA   EKG Interpretation Date/Time:  Tuesday May 03 2024 15:56:43 EST Ventricular Rate:  61 PR Interval:  180 QRS Duration:  88 QT Interval:  410 QTC Calculation: 412 R Axis:   86  Text Interpretation: Normal sinus rhythm Normal ECG When compared with ECG of 15-Feb-2024 11:06, No significant change was found Confirmed by Sheron Hallmark ([id number]) on 05/03/2024 3:59:47 PM   Physical Exam:   VS:  BP 138/80 (Patient Position: Sitting, Cuff Size: Normal)   Pulse 68   Ht 5' 4 (1.626 m)   Wt 167 lb (75.8 kg)   LMP 02/13/2014   SpO2 98%   BMI 28.67 kg/m    Wt Readings from Last 3 Encounters:  05/03/24 167 lb (75.8 kg)  02/15/24 167 lb (75.8 kg)    GEN: Well nourished, well developed in no acute distress while sitting in chair.  NECK: No JVD; No carotid bruits CARDIAC: RRR, no murmurs, rubs, gallops RESPIRATORY:  Clear to auscultation without rales, wheezing or rhonchi  ABDOMEN: Soft, non-tender, non-distended EXTREMITIES:  No edema; No deformity   ASSESSMENT AND PLAN: .  SVT/Palpations  Referred to heartcare for Palpitations in 01/2024. PCP reported hx of SVT, however not found on EKG or rhythm strip. Improved after vagal maneuver by PCP. Dr. Alvan recommended medical management due to infrequent palpitation episodes.  Started Lopressor  25 mg every 8 hours as needed for palpitations. Recent ED visit at [facility] 03/28/2024 for palpitations and diagnosed with SVT. Converted back to NSR with vagal maneuver Reviewed EKG 03/28/2024 confirmed SVT with a rate of 152  bpm 03/2024: T4 Low at 0.71. K borderline low normal at 3.6. Mg borderline low normal at 1.7.  Reports no recurrent palpitations since ED visit, however does note twinge in chest as below, which seem more related to heart arrhthymias.  Order Zio x 2 weeks.    Atypical Chest pain  Reports intermittent chest pain described as twinges lasting for seconds occurring multiple times per day since ED visit.  Denies any exertional angina. EKG today: NSR w/o acute ischemic changes Less concern for ACS based on presentation.  Suspect related to SVT.  Ordered ZIO as above.  If no significant findings on heart monitor and chest pains continue daily would consider exercise Myoview. Discussed if chest pain worsens in intensity or duration then contact office. Discussed ED precautions.    Dispo: Follow-up as scheduled with JB  Signed, Lorette CINDERELLA Kapur, PA-C

## 2024-05-03 NOTE — Patient Instructions (Addendum)
 DASH Eating Plan DASH stands for Dietary Approaches to Stop Hypertension. The DASH eating plan is a healthy eating plan that has been shown to: Lower high blood pressure (hypertension). Reduce your risk for type 2 diabetes, heart disease, and stroke. Help with weight loss. What are tips for following this plan? Reading food labels Check food labels for the amount of salt (sodium) per serving. Choose foods with less than 5 percent of the Daily Value (DV) of sodium. In general, foods with less than 300 milligrams (mg) of sodium per serving fit into this eating plan. To find whole grains, look for the word whole as the first word in the ingredient list. Shopping Buy products labeled as low-sodium or no salt added. Buy fresh foods. Avoid canned foods and pre-made or frozen meals. Cooking Try not to add salt when you cook. Use salt-free seasonings or herbs instead of table salt or sea salt. Check with your health care provider or pharmacist before using salt substitutes. Do not fry foods. Cook foods in healthy ways, such as baking, boiling, grilling, roasting, or broiling. Cook using oils that are good for your heart. These include olive, canola, avocado, soybean, and sunflower oil. Meal planning  Eat a balanced diet. This should include: 4 or more servings of fruits and 4 or more servings of vegetables each day. Try to fill half of your plate with fruits and vegetables. 6-8 servings of whole grains each day. 6 or less servings of lean meat, poultry, or fish each day. 1 oz is 1 serving. A 3 oz (85 g) serving of meat is about the same size as the palm of your hand. One egg is 1 oz (28 g). 2-3 servings of low-fat dairy each day. One serving is 1 cup (237 mL). 1 serving of nuts, seeds, or beans 5 times each week. 2-3 servings of heart-healthy fats. Healthy fats called omega-3 fatty acids are found in foods such as walnuts, flaxseeds, fortified milks, and eggs. These fats are also found in  cold-water fish, such as sardines, salmon, and mackerel. Limit how much you eat of: Canned or prepackaged foods. Food that is high in trans fat, such as fried foods. Food that is high in saturated fat, such as fatty meat. Desserts and other sweets, sugary drinks, and other foods with added sugar. Full-fat dairy products. Do not salt foods before eating. Do not eat more than 4 egg yolks a week. Try to eat at least 2 vegetarian meals a week. Eat more home-cooked food and less restaurant, buffet, and fast food. Lifestyle When eating at a restaurant, ask if your food can be made with less salt or no salt. If you drink alcohol: Limit how much you have to: 0-1 drink a day if you are female. 0-2 drinks a day if you are female. Know how much alcohol is in your drink. In the [place] one drink is one 12 oz bottle of beer (355 mL), one 5 oz glass of wine (148 mL), or one 1 oz glass of hard liquor (44 mL). General information Avoid eating more than 2,300 mg of salt a day. If you have hypertension, you may need to reduce your sodium intake to 1,500 mg a day. Work with your provider to stay at a healthy body weight or lose weight. Ask what the best weight range is for you. On most days of the week, get at least 30 minutes of exercise that causes your heart to beat faster. This may include walking, swimming, or  biking. Work with your provider or dietitian to adjust your eating plan to meet your specific calorie needs. What foods should I eat? Fruits All fresh, dried, or frozen fruit. Canned fruits that are in their natural juice and do not have sugar added to them. Vegetables Fresh or frozen vegetables that are raw, steamed, roasted, or grilled. Low-sodium or reduced-sodium tomato and vegetable juice. Low-sodium or reduced-sodium tomato sauce and tomato paste. Low-sodium or reduced-sodium canned vegetables. Grains Whole-grain or whole-wheat bread. Whole-grain or whole-wheat pasta. Brown rice. Mcneil Madeira. Bulgur. Whole-grain and low-sodium cereals. Pita bread. Low-fat, low-sodium crackers. Whole-wheat flour tortillas. Meats and other proteins Skinless chicken or turkey. Ground chicken or turkey. Pork with fat trimmed off. Fish and seafood. Egg whites. Dried beans, peas, or lentils. Unsalted nuts, nut butters, and seeds. Unsalted canned beans. Lean cuts of beef with fat trimmed off. Low-sodium, lean precooked or cured meat, such as sausages or meat loaves. Dairy Low-fat (1%) or fat-free (skim) milk. Reduced-fat, low-fat, or fat-free cheeses. Nonfat, low-sodium ricotta or cottage cheese. Low-fat or nonfat yogurt. Low-fat, low-sodium cheese. Fats and oils Soft margarine without trans fats. Vegetable oil. Reduced-fat, low-fat, or light mayonnaise and salad dressings (reduced-sodium). Canola, safflower, olive, avocado, soybean, and sunflower oils. Avocado. Seasonings and condiments Herbs. Spices. Seasoning mixes without salt. Other foods Unsalted popcorn and pretzels. Fat-free sweets. The items listed above may not be all the foods and drinks you can have. Talk to a dietitian to learn more. What foods should I avoid? Fruits Canned fruit in a light or heavy syrup. Fried fruit. Fruit in cream or butter sauce. Vegetables Creamed or fried vegetables. Vegetables in a cheese sauce. Regular canned vegetables that are not marked as low-sodium or reduced-sodium. Regular canned tomato sauce and paste that are not marked as low-sodium or reduced-sodium. Regular tomato and vegetable juices that are not marked as low-sodium or reduced-sodium. Dene. Olives. Grains Baked goods made with fat, such as croissants, muffins, or some breads. Dry pasta or rice meal packs. Meats and other proteins Fatty cuts of meat. Ribs. Fried meat. Aldona. Bologna, salami, and other precooked or cured meats, such as sausages or meat loaves, that are not lean and low in sodium. Fat from the back of a pig (fatback). Bratwurst.  Salted nuts and seeds. Canned beans with added salt. Canned or smoked fish. Whole eggs or egg yolks. Chicken or turkey with skin. Dairy Whole or 2% milk, cream, and half-and-half. Whole or full-fat cream cheese. Whole-fat or sweetened yogurt. Full-fat cheese. Nondairy creamers. Whipped toppings. Processed cheese and cheese spreads. Fats and oils Butter. Stick margarine. Lard. Shortening. Ghee. Bacon fat. Tropical oils, such as coconut, palm kernel, or palm oil. Seasonings and condiments Onion salt, garlic salt, seasoned salt, table salt, and sea salt. Worcestershire sauce. Tartar sauce. Barbecue sauce. Teriyaki sauce. Soy sauce, including reduced-sodium soy sauce. Steak sauce. Canned and packaged gravies. Fish sauce. Oyster sauce. Cocktail sauce. Store-bought horseradish. Ketchup. Mustard. Meat flavorings and tenderizers. Bouillon cubes. Hot sauces. Pre-made or packaged marinades. Pre-made or packaged taco seasonings. Relishes. Regular salad dressings. Other foods Salted popcorn and pretzels. The items listed above may not be all the foods and drinks you should avoid. Talk to a dietitian to learn more. Where to find more information National Heart, Lung, and Blood Institute (NHLBI): buffalodrycleaner.gl American Heart Association (AHA): heart.org Academy of Nutrition and Dietetics: eatright.org National Kidney Foundation (NKF): kidney.org This information is not intended to replace advice given to you by your health care provider. Make sure  you discuss any questions you have with your health care provider. Document Revised: 05/29/2022 Document Reviewed: 05/29/2022 Elsevier Patient Education  2024 Elsevier Inc.            Medication Instructions:   Your physician recommends that you continue on your current medications as directed. Please refer to the Current Medication list given to you today.   Labwork: None today  Testing/Procedures: ZIO XT- Long Term Monitor Instructions   Your  physician has requested you wear your ZIO patch monitor____14___days.   This is a single patch monitor.  [employer] supplies one patch monitor per enrollment.  Additional stickers are not available.   Please do not apply patch if you will be having a Nuclear Stress Test, Echocardiogram, Cardiac CT, MRI, or Chest Xray during the time frame you would be wearing the monitor. The patch cannot be worn during these tests.  You cannot remove and re-apply the ZIO XT patch monitor.       Do not shower for the first 24 hours.  You may shower after the first 24 hours.   Press button if you feel a symptom. You will hear a small click.  Record Date, Time and Symptom in the Patient Log Book.   When you are ready to remove patch, follow instructions on last 2 pages of Patient Log Book.  Stick patch monitor onto last page of Patient Log Book.   Place Patient Log Book in [place] box.  Use locking tab on box and tape box closed securely.  The [employer] has [employer] on it.  Please place in mailbox as soon as possible.  Your physician should have your test results approximately 7 days after the monitor has been mailed back to [facility].   Call [facility] Customer Care at [phone] if you have questions regarding your ZIO XT patch monitor.  Call them immediately if you see an orange light blinking on your monitor.   If your monitor falls off in less than 4 days contact our Monitor department at [phone].  If your monitor becomes loose or falls off after 4 days call [employer] at [phone] for suggestions on securing your monitor.    Follow-Up: Keep already scheduled appointment 08/09/24 at 8:40 am with Dr.Branch  Any Other Special Instructions Will Be Listed Below (If Applicable).  If you need a refill on your cardiac medications before your next appointment, please call your pharmacy.

## 2024-05-11 ENCOUNTER — Ambulatory Visit: Admitting: Student

## 2024-05-11 NOTE — Progress Notes (Shared)
 [facility] - Clinic Note  05/13/2024     CHIEF COMPLAINT Patient presents for No chief complaint on file.   HISTORY OF PRESENT ILLNESS: Sonya Anderson is a 58 y.o. female who presents to the clinic today for:     Pt states she has had some issues w/ her tachycardia twice this year. Saw a cardiologist and was placed on metoprolol  PRN. Had another spell 3 weeks ago w/ a heart rate of 203 along with a raise in BP. Went to the ER. Was decided that she'd be on metoprolol  BID to prevent tachycardia and elevated BP.   Referring physician: Celestine Lunger, OD [address] [place], [place] [address]  HISTORICAL INFORMATION:   Selected notes from the MEDICAL RECORD NUMBER Referred by Dr. Lunger for BRVO w/ CME OS LEE:  Ocular Hx- CME and BRVO OS PMH-    CURRENT MEDICATIONS: No current outpatient medications on file. (Ophthalmic Drugs)   No current facility-administered medications for this visit. (Ophthalmic Drugs)   Current Outpatient Medications (Other)  Medication Sig   ALPRAZolam (XANAX) 0.5 MG tablet Take 0.5 mg by mouth 2 (two) times daily.   busPIRone (BUSPAR) 5 MG tablet Take 5 mg by mouth 2 (two) times daily.   hydrOXYzine (ATARAX) 25 MG tablet Take 25 mg by mouth daily.   metoprolol  tartrate (LOPRESSOR ) 25 MG tablet Take 25 mg by mouth 2 (two) times daily.   omeprazole (PRILOSEC) 40 MG capsule Take by mouth.   No current facility-administered medications for this visit. (Other)   REVIEW OF SYSTEMS:     ALLERGIES No Known Allergies  PAST MEDICAL HISTORY Past Medical History:  Diagnosis Date   DJD (degenerative joint disease)    GERD (gastroesophageal reflux disease)    Past Surgical History:  Procedure Laterality Date   CESAREAN SECTION     CHOLECYSTECTOMY     SPINAL FUSION     WISDOM TOOTH EXTRACTION     FAMILY HISTORY Family History  Problem Relation Age of Onset   Heart failure Mother    Diabetes Father     SOCIAL HISTORY Social  History   Tobacco Use   Smoking status: Never   Smokeless tobacco: Never  Vaping Use   Vaping status: Never Used  Substance Use Topics   Alcohol use: Never   Drug use: Never       OPHTHALMIC EXAM:  Not recorded    IMAGING AND PROCEDURES  Imaging and Procedures for 05/13/2024           ASSESSMENT/PLAN:  No diagnosis found.  BRVO w/ CME OS - s/p IVA OS #1 (06.07.23), #2 (07.06.23), #3 (08.04.23), #4 (09.01.23) -- IVA resistance ======================= - s/p IVE OS #1 (10.06.23), #2 (11.06.23), #3 (12.04.23), #4 (01.08.24), #5 (02.26.24), #6 (04.15.24), #7 (06.03.24) #8 (07.26.24) -- IVE resistance ======================= - s/p IVV OS #1 (09.13.24 -- SAMPLE) , #2 (10.16.24), #3 (11.22.24), #4 (01.03.25), #5 (02.07.25), #6 (03.14.25), #7 (04.18.25), #8 (05.23.25), #9 (06.27.25) #10 (08.01.2025) #11 (09.12.25) #12(10.17.25) #13(11.21.25) ======================= **history of increased IRF at 7 wks on 09.13.24 and 07.26.24 visits; at 6 wks on 01.03.25** - 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 Hypertensive retinopathy OU - discussed importance of tight BP control - monitor   Ophthalmic Meds Ordered this visit:  No orders of the defined types were placed in this encounter.    No follow-ups on file.  There are no Patient Instructions on file for this visit.  This document serves  as a record of services personally performed by Redell JUDITHANN Hans, MD, PhD. It was created on their behalf by Delon Newness COT, an ophthalmic technician. The creation of this record is the provider's dictation and/or activities during the visit.    Electronically signed by: Delon Newness COT 12.17.25 10:43 AM   Abbreviations: M myopia (nearsighted); A astigmatism; H hyperopia (farsighted); P presbyopia; Mrx spectacle prescription;  CTL contact lenses; OD right eye; OS left eye; OU both eyes  XT exotropia; ET esotropia; PEK punctate epithelial keratitis; PEE punctate epithelial erosions; DES dry eye syndrome; MGD meibomian gland dysfunction; ATs artificial tears; PFAT's preservative free artificial tears; NSC nuclear sclerotic cataract; PSC posterior subcapsular cataract; ERM epi-retinal membrane; PVD posterior vitreous detachment; RD retinal detachment; DM diabetes mellitus; DR diabetic retinopathy; NPDR non-proliferative diabetic retinopathy; PDR proliferative diabetic retinopathy; CSME clinically significant macular edema; DME diabetic macular edema; dbh dot blot hemorrhages; CWS cotton wool spot; POAG primary open angle glaucoma; C/D cup-to-disc ratio; HVF humphrey visual field; GVF goldmann visual field; OCT optical coherence tomography; IOP intraocular pressure; BRVO Branch retinal vein occlusion; CRVO central retinal vein occlusion; CRAO central retinal artery occlusion; BRAO branch retinal artery occlusion; RT retinal tear; SB scleral buckle; PPV pars plana vitrectomy; VH Vitreous hemorrhage; PRP panretinal laser photocoagulation; IVK intravitreal kenalog; VMT vitreomacular traction; MH Macular hole;  NVD neovascularization of the disc; NVE neovascularization elsewhere; AREDS age related eye disease study; ARMD age related macular degeneration; POAG primary open angle glaucoma; EBMD epithelial/anterior basement membrane dystrophy; ACIOL anterior chamber intraocular lens; IOL intraocular lens;  PCIOL posterior chamber intraocular lens; Phaco/IOL phacoemulsification with intraocular lens placement; PRK photorefractive keratectomy; LASIK laser assisted in situ keratomileusis; HTN hypertension; DM diabetes mellitus; COPD chronic obstructive pulmonary disease

## 2024-05-13 ENCOUNTER — Ambulatory Visit (INDEPENDENT_AMBULATORY_CARE_PROVIDER_SITE_OTHER): Admitting: Ophthalmology

## 2024-05-13 ENCOUNTER — Encounter (INDEPENDENT_AMBULATORY_CARE_PROVIDER_SITE_OTHER): Payer: Self-pay | Admitting: Ophthalmology

## 2024-05-13 DIAGNOSIS — H34832 Tributary (branch) retinal vein occlusion, left eye, with macular edema: Secondary | ICD-10-CM | POA: Diagnosis not present

## 2024-05-13 DIAGNOSIS — H35033 Hypertensive retinopathy, bilateral: Secondary | ICD-10-CM

## 2024-05-13 DIAGNOSIS — I10 Essential (primary) hypertension: Secondary | ICD-10-CM

## 2024-05-13 MED ORDER — FARICIMAB-SVOA 6 MG/0.05ML IZ SOSY
6.0000 mg | PREFILLED_SYRINGE | INTRAVITREAL | Status: AC | PRN
  Administered 2024-05-13: 6 mg via INTRAVITREAL

## 2024-06-13 NOTE — Progress Notes (Signed)
 " [facility] - Clinic Note  06/17/2024     CHIEF COMPLAINT Patient presents for Retina Follow Up   HISTORY OF PRESENT ILLNESS: Sonya Anderson is a 59 y.o. female who presents to the clinic today for:   HPI     Retina Follow Up   Patient presents with  CRVO/BRVO.  In left eye.  Severity is moderate.  Duration of 5 weeks.  Since onset it is stable.  I, the attending physician,  performed the HPI with the patient and updated documentation appropriately.        Comments   5 week Retina eval. Patient states no vision changes      Last edited by Valdemar Rogue, MD on 06/17/2024  8:38 AM.     Patient states the vision is doing well.   Referring physician: Celestine Lunger, OD [address] [place], [place] [address]  HISTORICAL INFORMATION:   Selected notes from the MEDICAL RECORD NUMBER Referred by Dr. Lunger for BRVO w/ CME OS LEE:  Ocular Hx- CME and BRVO OS PMH-    CURRENT MEDICATIONS: No current outpatient medications on file. (Ophthalmic Drugs)   No current facility-administered medications for this visit. (Ophthalmic Drugs)   Current Outpatient Medications (Other)  Medication Sig   ALPRAZolam (XANAX) 0.5 MG tablet Take 0.5 mg by mouth 2 (two) times daily.   busPIRone (BUSPAR) 5 MG tablet Take 5 mg by mouth 2 (two) times daily.   hydrOXYzine (ATARAX) 25 MG tablet Take 25 mg by mouth daily.   metoprolol  tartrate (LOPRESSOR ) 25 MG tablet Take 25 mg by mouth 2 (two) times daily.   omeprazole (PRILOSEC) 40 MG capsule Take by mouth.   No current facility-administered medications for this visit. (Other)   REVIEW OF SYSTEMS: ROS   Positive for: Eyes Negative for: Constitutional, Gastrointestinal, Neurological, Skin, Genitourinary, Musculoskeletal, HENT, Endocrine, Cardiovascular, Respiratory, Psychiatric, Allergic/Imm, Heme/Lymph Last edited by German Olam BRAVO, COT on 06/17/2024  7:47 AM.         ALLERGIES No Known Allergies  PAST MEDICAL  HISTORY Past Medical History:  Diagnosis Date   DJD (degenerative joint disease)    GERD (gastroesophageal reflux disease)    Past Surgical History:  Procedure Laterality Date   CESAREAN SECTION     CHOLECYSTECTOMY     SPINAL FUSION     WISDOM TOOTH EXTRACTION     FAMILY HISTORY Family History  Problem Relation Age of Onset   Heart failure Mother    Diabetes Father     SOCIAL HISTORY Social History   Tobacco Use   Smoking status: Never   Smokeless tobacco: Never  Vaping Use   Vaping status: Never Used  Substance Use Topics   Alcohol use: Never   Drug use: Never       OPHTHALMIC EXAM:  Base Eye Exam     Visual Acuity (Snellen - Linear)       Right Left   Dist cc 20/20 20/20    Correction: Glasses         Tonometry (Tonopen, 7:48 AM)       Right Left   Pressure 12 14         Pupils       Dark Light Shape React APD   Right 3 2 Round Brisk None   Left 3 2 Round Brisk None         Visual Fields (Counting fingers)       Left Right  Full Full         Extraocular Movement       Right Left    Full, Ortho Full, Ortho         Neuro/Psych     Oriented x3: Yes   Mood/Affect: Normal         Dilation     Both eyes: 1.0% Mydriacyl, 2.5% Phenylephrine @ 7:48 AM           Slit Lamp and Fundus Exam     Slit Lamp Exam       Right Left   Lids/Lashes Dermatochalasis - upper lid Dermatochalasis - upper lid   Conjunctiva/Sclera White and quiet White and quiet   Cornea tear film debris Trace tear film debris   Anterior Chamber deep, clear, narrow temporal angle deep, clear, narrow temporal angle   Iris Round and dilated Round and dilated   Lens Clear Clear   Anterior Vitreous mild syneresis mild syneresis         Fundus Exam       Right Left   Disc Pink and Sharp, mild PPP Pink and Sharp, mild PPP   C/D Ratio 0.2 0.2   Macula Flat, Good foveal reflex, RPE mottling, focal drusen IT to fovea, No heme or edema Blunted foveal  reflex, flame hemes and DBH IT macula -- stably improved, mild cystic changes IT macula -- increased, no exudates   Vessels attenuated, mild tortuosity attenuated, Tortuous -- IT BRVO   Periphery Attached Attached           Refraction     Wearing Rx       Sphere Cylinder Axis   Right +2.75 +0.25 117   Left +3.25 +0.25 060           IMAGING AND PROCEDURES  Imaging and Procedures for 06/17/2024  OCT, Retina - OU - Both Eyes       Right Eye Quality was good. Central Foveal Thickness: 260. Progression has been stable. Findings include normal foveal contour, no IRF, no SRF, retinal drusen , vitreomacular adhesion (Rare drusen).   Left Eye Quality was good. Central Foveal Thickness: 248. Progression has worsened. Findings include normal foveal contour, no SRF, intraretinal fluid, vitreomacular adhesion (persistent cystic changes IT macula -- slightly increased).   Notes *Images captured and stored on drive  Diagnosis / Impression:  OD: NFP, no IRF/SRF OS: BRVO w/ CME --  persistent cystic changes IT macula -- slightly increased  Clinical management:  See below  Abbreviations: NFP - Normal foveal profile. CME - cystoid macular edema. PED - pigment epithelial detachment. IRF - intraretinal fluid. SRF - subretinal fluid. EZ - ellipsoid zone. ERM - epiretinal membrane. ORA - outer retinal atrophy. ORT - outer retinal tubulation. SRHM - subretinal hyper-reflective material. IRHM - intraretinal hyper-reflective material      Intravitreal Injection, Pharmacologic Agent - OS - Left Eye       Time Out 06/17/2024. 7:54 AM. Confirmed correct patient, procedure, site, and patient consented.   Anesthesia Topical anesthesia was used. Anesthetic medications included Lidocaine 2%, Proparacaine 0.5%.   Procedure Preparation included 5% betadine to ocular surface, eyelid speculum. A supplied (32g) needle was used.   Injection: 6 mg faricimab -svoa 6 MG/0.05ML   Route:  Intravitreal, Site: Left Eye   NDC: 49757-903-93, Lot: [id number], Expiration date: 02/22/2025, Waste: 0 mL   Post-op Post injection exam found visual acuity of at least counting fingers. The patient tolerated the procedure well. There were no complications. The  patient received written and verbal post procedure care education. Post injection medications were not given.             ASSESSMENT/PLAN:    ICD-10-CM   1. Branch retinal vein occlusion of left eye with macular edema (HCC)  H34.8320 OCT, Retina - OU - Both Eyes    Intravitreal Injection, Pharmacologic Agent - OS - Left Eye    faricimab -svoa (VABYSMO ) 6mg /0.78mL intravitreal injection    2. Essential hypertension  I10     3. Hypertensive retinopathy of both eyes  H35.033      BRVO w/ CME OS **history of increased IRF at 7 wks on 09.13.24 and 07.26.24 visits; at 6 wks on 01.03.25** - BCVA OS 20/20 -- stable - s/p IVA OS #1 (06.07.23), #2 (07.06.23), #3 (08.04.23), #4 (09.01.23)  -- IVA resistance ======================= - s/p IVE OS #1 (10.06.23), #2 (11.06.23), #3 (12.04.23), #4 (01.08.24), #5 (02.26.24), #6 (04.15.24), #7 (06.03.24) #8 (07.26.24)  -- IVE resistance ======================= - s/p IVV OS #1 (09.13.24 SAMPLE), #2 (10.16.24), #3 (11.22.24),  #4 (01.03.25), #5 (02.07.25), #6 (03.14.25), #7 (04.18.25),  #8 (05.23.25), #9 (06.27.25), #10 (08.01.25), #11 (09.12.25),  #12 (10.17.25), #13 (11.21.25), #14 (12.19.25) - FA (06.02.23) shows focal BRVO inferior macula - originally, pt asymptomatic and didn't realize she had an issue until her routine eye exam - OCT shows persistent cystic changes IT macula -- slightly increased at 5 wks--consider focal laser in the future - **discussed decreased efficacy / resistance to Vabysmo  and the benefit of switching medication to Eylea  HD**   - will check insurance coverage for Eylea  HD - recommend IVV OS #15 today, 01.23.26 with f/u in 4 weeks - pt wishes to proceed with IVV  injection - RBA of procedure discussed, questions answered - IVV informed consent obtained and signed, OS (12.19.25) - see procedure note  - Vabysmo  auth obtained as of 10.18.24 - f/u 4 weeks, DFE, OCT, possible injxn--considering focal laser OS  2,3. Hypertensive retinopathy OU - discussed importance of tight BP control - monitor   Ophthalmic Meds Ordered this visit:  Meds ordered this encounter  Medications   faricimab -svoa (VABYSMO ) 6mg /0.26mL intravitreal injection     Return in about 4 weeks (around 07/15/2024) for f/u, BRVO, DFE, OCT, Possible, IVV, OS.  There are no Patient Instructions on file for this visit.  This document serves as a record of services personally performed by Redell JUDITHANN Hans, MD, PhD. It was created on their behalf by Delon Newness COT, an ophthalmic technician. The creation of this record is the provider's dictation and/or activities during the visit.    Electronically signed by: Delon Newness COT 01.19.2026  8:53 AM  This document serves as a record of services personally performed by Redell JUDITHANN Hans, MD, PhD. It was created on their behalf by Wanda GEANNIE Keens, COT an ophthalmic technician. The creation of this record is the provider's dictation and/or activities during the visit.    Electronically signed by:  Wanda GEANNIE Keens, COT  06/17/24 8:53 AM  Redell JUDITHANN Hans, M.D., Ph.D. Diseases & Surgery of the Retina and Vitreous [facility] 06/17/2024   I have reviewed the above documentation for accuracy and completeness, and I agree with the above. Redell JUDITHANN Hans, M.D., Ph.D. 06/17/24 8:54 AM   Abbreviations: M myopia (nearsighted); A astigmatism; H hyperopia (farsighted); P presbyopia; Mrx spectacle prescription;  CTL contact lenses; OD right eye; OS left eye; OU both eyes  XT exotropia; ET esotropia; PEK punctate  epithelial keratitis; PEE punctate epithelial erosions; DES dry eye syndrome; MGD meibomian gland  dysfunction; ATs artificial tears; PFAT's preservative free artificial tears; NSC nuclear sclerotic cataract; PSC posterior subcapsular cataract; ERM epi-retinal membrane; PVD posterior vitreous detachment; RD retinal detachment; DM diabetes mellitus; DR diabetic retinopathy; NPDR non-proliferative diabetic retinopathy; PDR proliferative diabetic retinopathy; CSME clinically significant macular edema; DME diabetic macular edema; dbh dot blot hemorrhages; CWS cotton wool spot; POAG primary open angle glaucoma; C/D cup-to-disc ratio; HVF humphrey visual field; GVF goldmann visual field; OCT optical coherence tomography; IOP intraocular pressure; BRVO Branch retinal vein occlusion; CRVO central retinal vein occlusion; CRAO central retinal artery occlusion; BRAO branch retinal artery occlusion; RT retinal tear; SB scleral buckle; PPV pars plana vitrectomy; VH Vitreous hemorrhage; PRP panretinal laser photocoagulation; IVK intravitreal kenalog; VMT vitreomacular traction; MH Macular hole;  NVD neovascularization of the disc; NVE neovascularization elsewhere; AREDS age related eye disease study; ARMD age related macular degeneration; POAG primary open angle glaucoma; EBMD epithelial/anterior basement membrane dystrophy; ACIOL anterior chamber intraocular lens; IOL intraocular lens; PCIOL posterior chamber intraocular lens; Phaco/IOL phacoemulsification with intraocular lens placement; PRK photorefractive keratectomy; LASIK laser assisted in situ keratomileusis; HTN hypertension; DM diabetes mellitus; COPD chronic obstructive pulmonary disease "

## 2024-06-16 DIAGNOSIS — I471 Supraventricular tachycardia, unspecified: Secondary | ICD-10-CM

## 2024-06-17 ENCOUNTER — Encounter (INDEPENDENT_AMBULATORY_CARE_PROVIDER_SITE_OTHER): Payer: Self-pay | Admitting: Ophthalmology

## 2024-06-17 ENCOUNTER — Ambulatory Visit (INDEPENDENT_AMBULATORY_CARE_PROVIDER_SITE_OTHER): Admitting: Ophthalmology

## 2024-06-17 DIAGNOSIS — H35033 Hypertensive retinopathy, bilateral: Secondary | ICD-10-CM

## 2024-06-17 DIAGNOSIS — I10 Essential (primary) hypertension: Secondary | ICD-10-CM | POA: Diagnosis not present

## 2024-06-17 DIAGNOSIS — H34832 Tributary (branch) retinal vein occlusion, left eye, with macular edema: Secondary | ICD-10-CM | POA: Diagnosis not present

## 2024-06-17 MED ORDER — FARICIMAB-SVOA 6 MG/0.05ML IZ SOSY
6.0000 mg | PREFILLED_SYRINGE | INTRAVITREAL | Status: AC | PRN
  Administered 2024-06-17: 6 mg via INTRAVITREAL

## 2024-06-20 ENCOUNTER — Ambulatory Visit: Payer: Self-pay | Admitting: Physician Assistant

## 2024-07-15 ENCOUNTER — Encounter (INDEPENDENT_AMBULATORY_CARE_PROVIDER_SITE_OTHER): Admitting: Ophthalmology

## 2024-08-09 ENCOUNTER — Ambulatory Visit: Admitting: Cardiology
